# Patient Record
Sex: Female | Born: 1955 | Race: White | Hispanic: No | Marital: Married | Smoking: Never smoker
Health system: Southern US, Community
[De-identification: ages and names within clinical notes are randomized; demographics above are authoritative.]

## PROBLEM LIST (undated history)

## (undated) DIAGNOSIS — I82409 Acute embolism and thrombosis of unspecified deep veins of unspecified lower extremity: Secondary | ICD-10-CM

## (undated) DIAGNOSIS — E7800 Pure hypercholesterolemia, unspecified: Secondary | ICD-10-CM

## (undated) DIAGNOSIS — I2699 Other pulmonary embolism without acute cor pulmonale: Secondary | ICD-10-CM

## (undated) DIAGNOSIS — I10 Essential (primary) hypertension: Secondary | ICD-10-CM

## (undated) DIAGNOSIS — F32A Depression, unspecified: Secondary | ICD-10-CM

## (undated) DIAGNOSIS — F419 Anxiety disorder, unspecified: Secondary | ICD-10-CM

## (undated) HISTORY — PX: ESOPHAGOGASTRODUODENOSCOPY: SHX1529

## (undated) HISTORY — PX: APPENDECTOMY: SHX54

## (undated) HISTORY — PX: CHOLECYSTECTOMY: SHX55

## (undated) HISTORY — PX: TUBAL LIGATION: SHX77

## (undated) HISTORY — PX: BACK SURGERY: SHX140

## (undated) HISTORY — PX: TOTAL KNEE ARTHROPLASTY: SHX125

---

## 2010-10-09 ENCOUNTER — Encounter: Admission: RE | Admit: 2010-10-09 | Discharge: 2010-10-09 | Payer: Self-pay | Admitting: Neurosurgery

## 2010-10-23 ENCOUNTER — Encounter
Admission: RE | Admit: 2010-10-23 | Discharge: 2010-10-23 | Payer: Self-pay | Source: Home / Self Care | Attending: Neurosurgery | Admitting: Neurosurgery

## 2010-10-27 ENCOUNTER — Ambulatory Visit (HOSPITAL_COMMUNITY)
Admission: RE | Admit: 2010-10-27 | Discharge: 2010-10-28 | Payer: Self-pay | Source: Home / Self Care | Attending: Neurosurgery | Admitting: Neurosurgery

## 2011-01-18 LAB — CBC
HCT: 38.8 % (ref 36.0–46.0)
MCH: 28.1 pg (ref 26.0–34.0)
MCV: 85.8 fL (ref 78.0–100.0)
Platelets: 317 10*3/uL (ref 150–400)
RBC: 4.52 MIL/uL (ref 3.87–5.11)
RDW: 12.8 % (ref 11.5–15.5)

## 2011-01-18 LAB — BASIC METABOLIC PANEL
BUN: 7 mg/dL (ref 6–23)
CO2: 30 mEq/L (ref 19–32)
Chloride: 99 mEq/L (ref 96–112)
Creatinine, Ser: 0.82 mg/dL (ref 0.4–1.2)
Potassium: 3.3 mEq/L — ABNORMAL LOW (ref 3.5–5.1)

## 2011-04-29 ENCOUNTER — Other Ambulatory Visit: Payer: Self-pay | Admitting: Neurosurgery

## 2011-04-29 DIAGNOSIS — M549 Dorsalgia, unspecified: Secondary | ICD-10-CM

## 2011-04-29 DIAGNOSIS — M5410 Radiculopathy, site unspecified: Secondary | ICD-10-CM

## 2011-05-04 ENCOUNTER — Other Ambulatory Visit: Payer: Self-pay

## 2011-05-05 ENCOUNTER — Ambulatory Visit
Admission: RE | Admit: 2011-05-05 | Discharge: 2011-05-05 | Disposition: A | Discharge status: Home / Self Care | Payer: BC Managed Care – PPO | Source: Ambulatory Visit | Attending: Neurosurgery | Admitting: Neurosurgery

## 2011-05-05 DIAGNOSIS — M5410 Radiculopathy, site unspecified: Secondary | ICD-10-CM

## 2011-05-05 DIAGNOSIS — M549 Dorsalgia, unspecified: Secondary | ICD-10-CM

## 2012-01-15 IMAGING — RF DG MYELOGRAM LUMBAR
14 of 23 series · 14 of 23 positions shown · IV contrast (omnipaque)
Comparison: CT myelogram 10/09/2010.

MYELOGRAM INJECTION
TECHNIQUE: Informed consent was obtained from the patient prior to
the procedure, including potential complications of headache,
allergy, infection and pain. Specific instructions were given
regarding 24 hour bedrest post procedure to prevent post-LP
headache.  A timeout procedure was performed.  With the patient
prone, the lower back was prepped with Betadine.  1% Lidocaine was
used for local anesthesia.  Lumbar puncture was performed by the
radiologist at the L4-L5 level using a 22 gauge needle with return
of clear CSF.  15 cc of Omnipaque 180 was injected into the
subarachnoid space .
CLINICAL DATA: Low back pain after standing.  Prior lumbar
decompression for disc disease and foraminal narrowing at L4-5 and
L5-S1 on the left.  Overall improved left leg pain.
TECHNIQUE: Multidetector CT imaging of the lumbar spine was
performed following myelography.  Multiplanar CT image
reconstructions were also generated.

[Series 1: (hospital) · 1 of 1 slices shown]
[im 1/1]
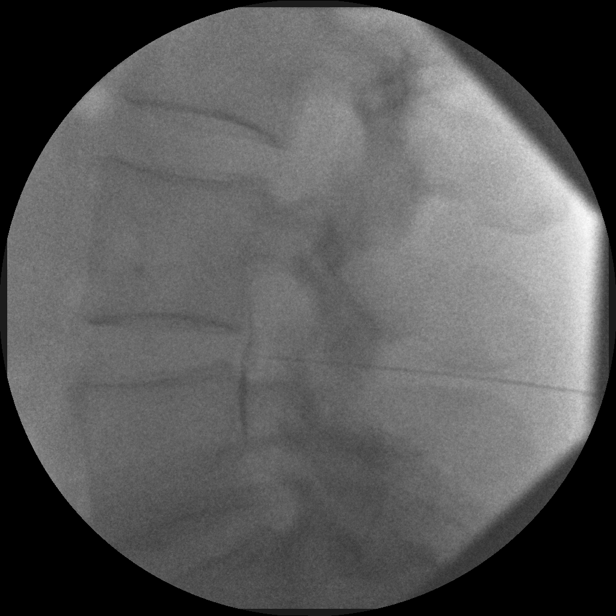

[Series 3: myelogram  white · 1 of 1 slices shown (1 of 11)]
[im 1/1]
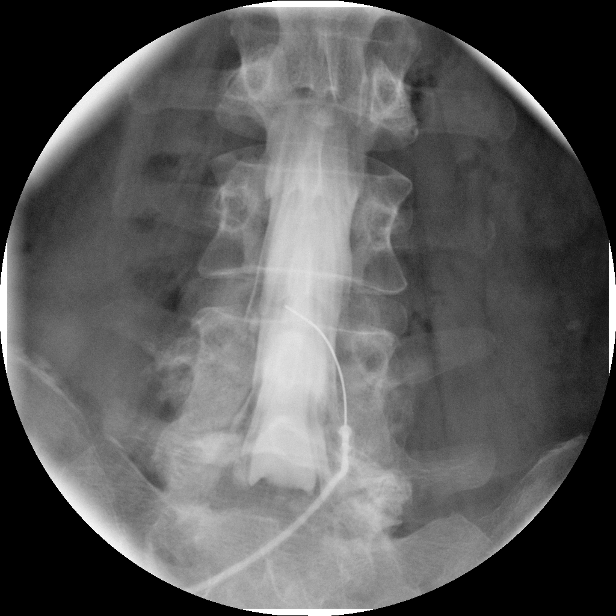

[Series 5: myelogram  white · 1 of 1 slices shown (2 of 11)]
[im 1/1]
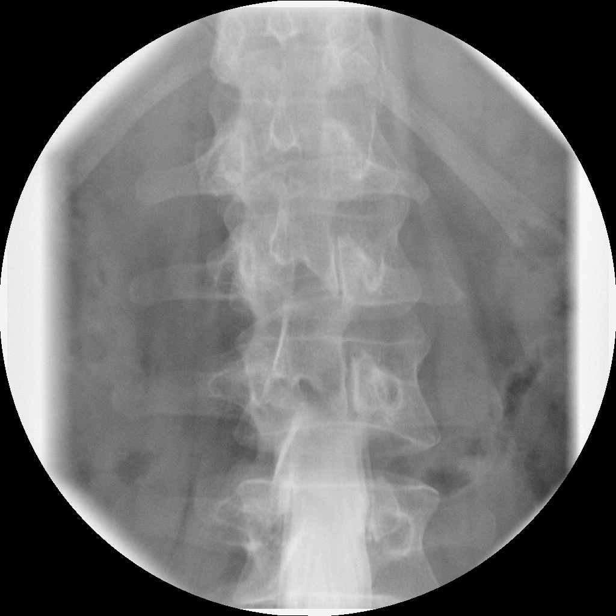

[Series 6: myelogram  white · 1 of 1 slices shown (3 of 11)]
[im 1/1]
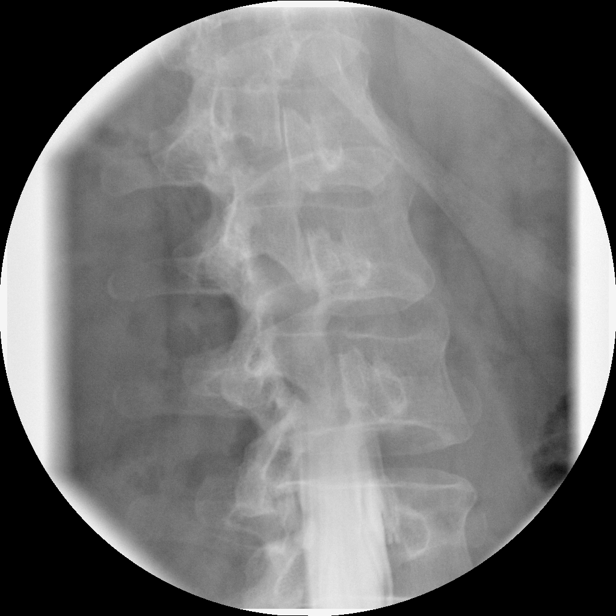

[Series 8: myelogram  white · 1 of 1 slices shown (4 of 11)]
[im 1/1]
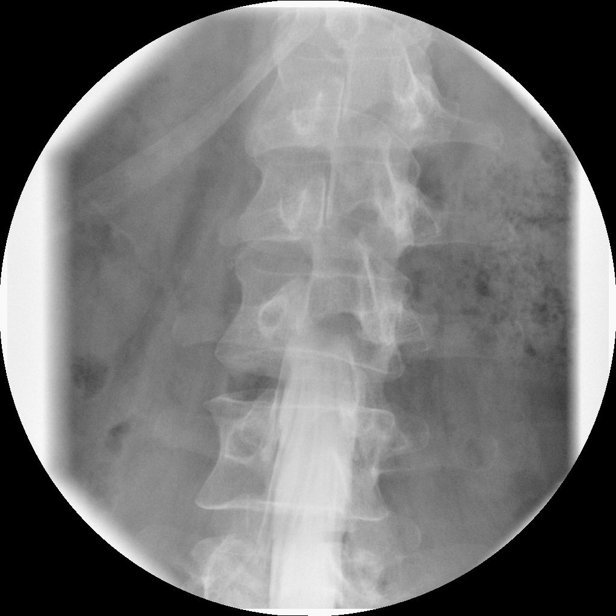

[Series 10: myelogram  white · 1 of 1 slices shown (5 of 11)]
[im 1/1]
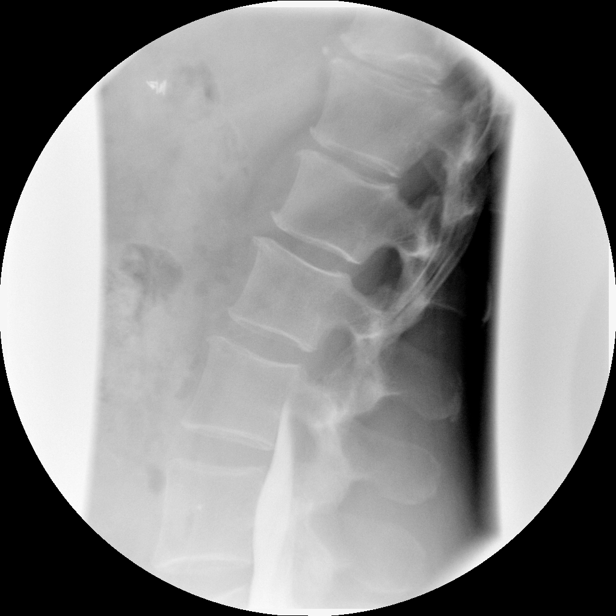

[Series 11: myelogram  white · 1 of 1 slices shown (6 of 11)]
[im 1/1]
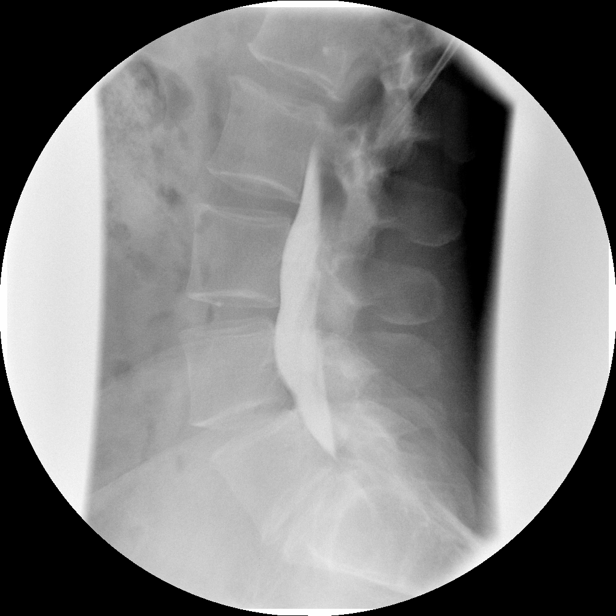

[Series 13: myelogram  white · 1 of 1 slices shown (7 of 11)]
[im 1/1]
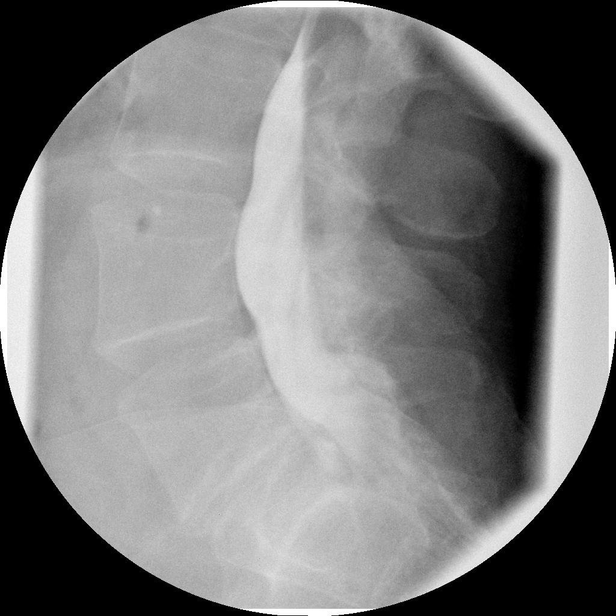

[Series 14: myelogram  white · 1 of 1 slices shown (8 of 11)]
[im 1/1]
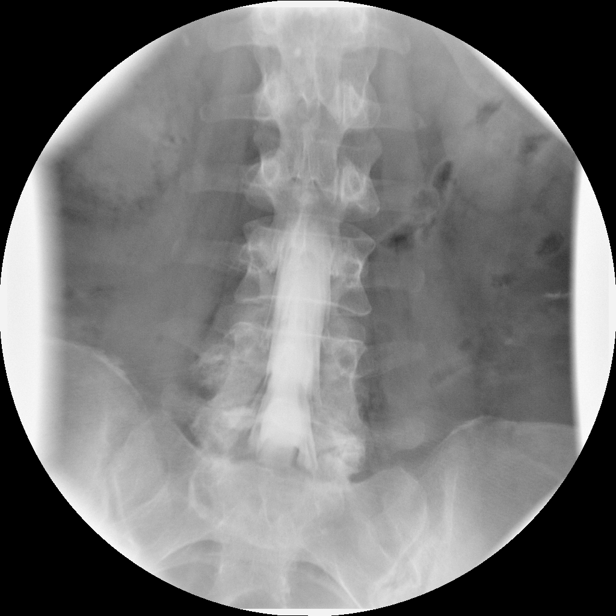

[Series 16: myelogram  white · 1 of 1 slices shown (9 of 11)]
[im 1/1]
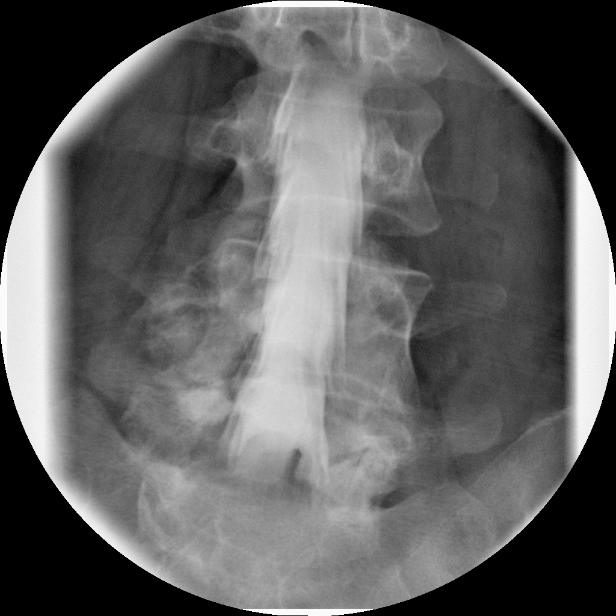

[Series 18: myelogram  white · 1 of 1 slices shown (10 of 11)]
[im 1/1]
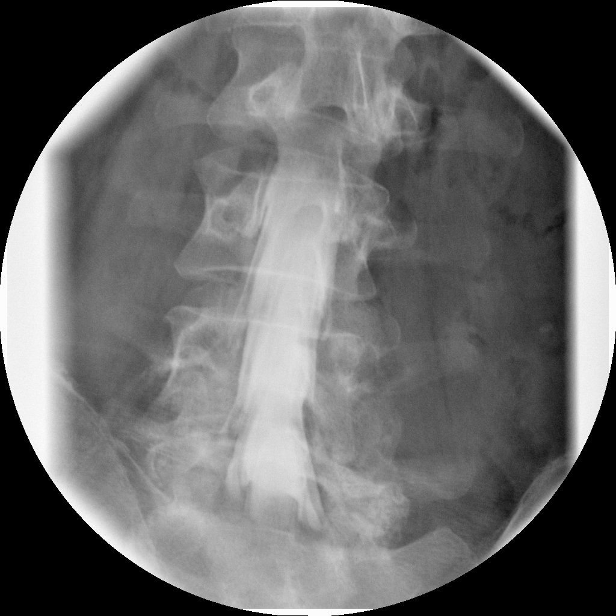

[Series 19: myelogram  white · 1 of 1 slices shown (11 of 11)]
[im 1/1]
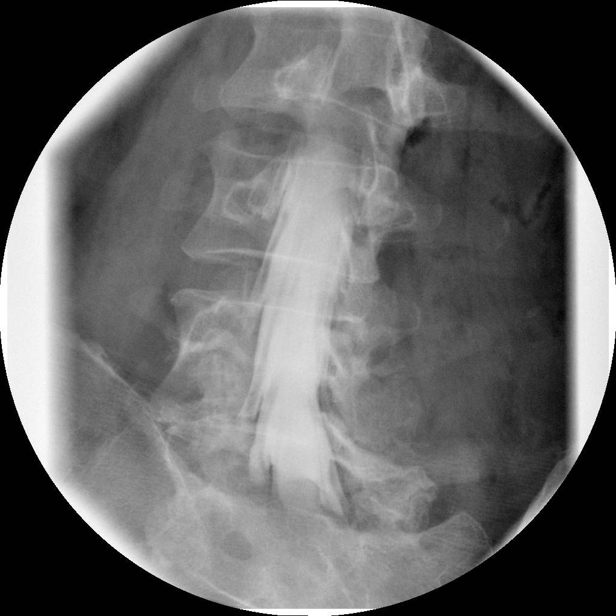

[Series 1002: view not recorded · 0.20mm/px · 1 of 1 slices shown (1 of 2)]
[im 1/1]
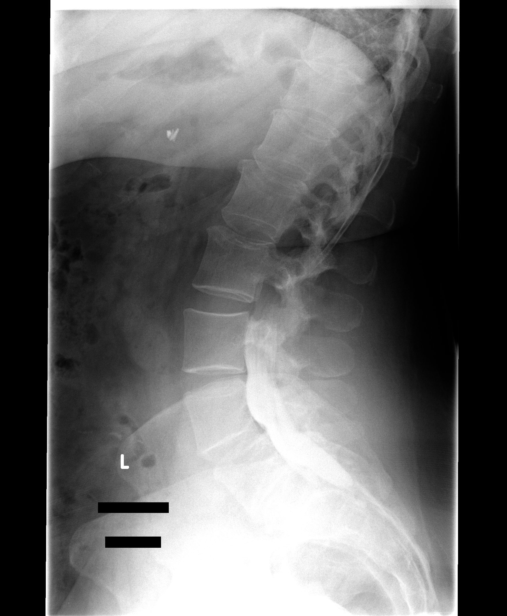

[Series 1004: view not recorded · 0.20mm/px · 1 of 1 slices shown (2 of 2)]
[im 1/1]
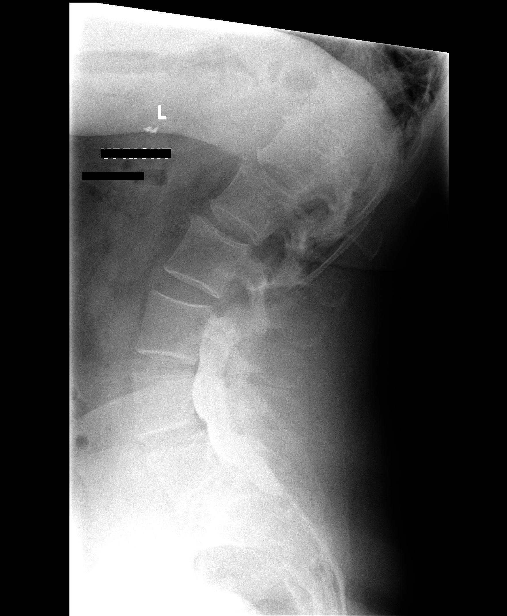

[14 of 23 positions shown; findings below may reference images not displayed]

IMPRESSION: Successful injection of  intrathecal contrast for myelography.

MYELOGRAM LUMBAR
FINDINGS: Transitional anatomy is present. Today's numbering scheme
corresponds to the previous myelogram and intraoperative films.

Good opacification lumbar subarachnoid space.  Anatomic alignment
is present.     There is mild disc space narrowing at L5-S1.  There
is no nerve root cut off or spinal stenosis.  Flexion/extension
show no abnormal movement.  Shallow ventral defect L5-S1.  Minimal
anterolisthesis S1 on S2.

Fluoroscopy Time: 1.23 minutes
IMPRESSION: As above.

CT MYELOGRAPHY LUMBAR SPINE
FINDINGS: No prevertebral or paraspinous masses

L1-2: Normal disc space

L2-3: Normal disc space

L3-4: Normal disc space.

L4-5: Postsurgical changes, left. Mild bulge.  Mild facet
arthropathy.  No compressive lesion.

L5-S1: Shallow central protrusion.  Postsurgical changes, left.
Severe left and moderate right facet arthropathy.  No compressive
lesion.

S1-S2:  Severe right and moderate left facet arthropathy.  2-3 ml
anterolisthesis.  Shallow calcific protrusion.  No neural
compression.

Compared to the preoperative CT myelogram, the nerve root
compression at L4-5 and L5-S1 is improved.
IMPRESSION: Unremarkable CT myelogram status post left L4-5 and left L5-S1
decompression.  The dominant finding remains advanced lower lumbar
facet arthropathy.

## 2013-09-17 DIAGNOSIS — G8929 Other chronic pain: Secondary | ICD-10-CM | POA: Insufficient documentation

## 2013-09-17 DIAGNOSIS — M47817 Spondylosis without myelopathy or radiculopathy, lumbosacral region: Secondary | ICD-10-CM | POA: Insufficient documentation

## 2013-09-17 DIAGNOSIS — M47816 Spondylosis without myelopathy or radiculopathy, lumbar region: Secondary | ICD-10-CM | POA: Insufficient documentation

## 2013-09-17 DIAGNOSIS — M545 Low back pain, unspecified: Secondary | ICD-10-CM | POA: Insufficient documentation

## 2013-09-17 HISTORY — DX: Spondylosis without myelopathy or radiculopathy, lumbosacral region: M47.817

## 2013-09-17 HISTORY — DX: Low back pain, unspecified: M54.50

## 2015-12-25 DIAGNOSIS — E669 Obesity, unspecified: Secondary | ICD-10-CM

## 2015-12-25 DIAGNOSIS — K589 Irritable bowel syndrome without diarrhea: Secondary | ICD-10-CM

## 2015-12-25 DIAGNOSIS — G4733 Obstructive sleep apnea (adult) (pediatric): Secondary | ICD-10-CM

## 2015-12-25 DIAGNOSIS — M5136 Other intervertebral disc degeneration, lumbar region: Secondary | ICD-10-CM

## 2015-12-25 DIAGNOSIS — F325 Major depressive disorder, single episode, in full remission: Secondary | ICD-10-CM | POA: Insufficient documentation

## 2015-12-25 DIAGNOSIS — J4520 Mild intermittent asthma, uncomplicated: Secondary | ICD-10-CM | POA: Insufficient documentation

## 2015-12-25 DIAGNOSIS — N398 Other specified disorders of urinary system: Secondary | ICD-10-CM

## 2015-12-25 DIAGNOSIS — K219 Gastro-esophageal reflux disease without esophagitis: Secondary | ICD-10-CM

## 2015-12-25 DIAGNOSIS — I10 Essential (primary) hypertension: Secondary | ICD-10-CM

## 2015-12-25 DIAGNOSIS — J309 Allergic rhinitis, unspecified: Secondary | ICD-10-CM

## 2015-12-25 DIAGNOSIS — M51369 Other intervertebral disc degeneration, lumbar region without mention of lumbar back pain or lower extremity pain: Secondary | ICD-10-CM

## 2015-12-25 DIAGNOSIS — R5382 Chronic fatigue, unspecified: Secondary | ICD-10-CM | POA: Insufficient documentation

## 2015-12-25 DIAGNOSIS — Z79899 Other long term (current) drug therapy: Secondary | ICD-10-CM | POA: Insufficient documentation

## 2015-12-25 DIAGNOSIS — R102 Pelvic and perineal pain: Secondary | ICD-10-CM | POA: Insufficient documentation

## 2015-12-25 DIAGNOSIS — F411 Generalized anxiety disorder: Secondary | ICD-10-CM

## 2015-12-25 HISTORY — DX: Essential (primary) hypertension: I10

## 2015-12-25 HISTORY — DX: Other intervertebral disc degeneration, lumbar region without mention of lumbar back pain or lower extremity pain: M51.369

## 2015-12-25 HISTORY — DX: Obstructive sleep apnea (adult) (pediatric): G47.33

## 2015-12-25 HISTORY — DX: Gastro-esophageal reflux disease without esophagitis: K21.9

## 2015-12-25 HISTORY — DX: Chronic fatigue, unspecified: R53.82

## 2015-12-25 HISTORY — DX: Major depressive disorder, single episode, in full remission: F32.5

## 2015-12-25 HISTORY — DX: Mild intermittent asthma, uncomplicated: J45.20

## 2015-12-25 HISTORY — DX: Generalized anxiety disorder: F41.1

## 2015-12-25 HISTORY — DX: Allergic rhinitis, unspecified: J30.9

## 2015-12-25 HISTORY — DX: Irritable bowel syndrome, unspecified: K58.9

## 2015-12-25 HISTORY — DX: Other specified disorders of urinary system: N39.8

## 2015-12-25 HISTORY — DX: Other intervertebral disc degeneration, lumbar region: M51.36

## 2015-12-25 HISTORY — DX: Obesity, unspecified: E66.9

## 2016-09-02 DIAGNOSIS — M546 Pain in thoracic spine: Secondary | ICD-10-CM | POA: Insufficient documentation

## 2017-02-22 DIAGNOSIS — E559 Vitamin D deficiency, unspecified: Secondary | ICD-10-CM | POA: Insufficient documentation

## 2017-02-22 HISTORY — DX: Vitamin D deficiency, unspecified: E55.9

## 2017-02-22 HISTORY — DX: Hypercalcemia: E83.52

## 2017-07-12 DIAGNOSIS — G43909 Migraine, unspecified, not intractable, without status migrainosus: Secondary | ICD-10-CM

## 2017-07-12 HISTORY — DX: Migraine, unspecified, not intractable, without status migrainosus: G43.909

## 2017-08-25 DIAGNOSIS — M791 Myalgia, unspecified site: Secondary | ICD-10-CM | POA: Insufficient documentation

## 2017-09-21 DIAGNOSIS — E785 Hyperlipidemia, unspecified: Secondary | ICD-10-CM | POA: Diagnosis not present

## 2017-09-21 DIAGNOSIS — I10 Essential (primary) hypertension: Secondary | ICD-10-CM

## 2017-09-21 DIAGNOSIS — R079 Chest pain, unspecified: Secondary | ICD-10-CM

## 2017-09-21 DIAGNOSIS — N179 Acute kidney failure, unspecified: Secondary | ICD-10-CM | POA: Diagnosis not present

## 2017-09-21 DIAGNOSIS — J45909 Unspecified asthma, uncomplicated: Secondary | ICD-10-CM | POA: Diagnosis not present

## 2017-09-21 DIAGNOSIS — K219 Gastro-esophageal reflux disease without esophagitis: Secondary | ICD-10-CM | POA: Diagnosis not present

## 2018-03-17 DIAGNOSIS — K2930 Chronic superficial gastritis without bleeding: Secondary | ICD-10-CM | POA: Insufficient documentation

## 2018-03-17 HISTORY — DX: Chronic superficial gastritis without bleeding: K29.30

## 2018-06-02 DIAGNOSIS — N134 Hydroureter: Secondary | ICD-10-CM | POA: Insufficient documentation

## 2018-11-20 DIAGNOSIS — R7309 Other abnormal glucose: Secondary | ICD-10-CM | POA: Insufficient documentation

## 2020-05-29 DIAGNOSIS — E782 Mixed hyperlipidemia: Secondary | ICD-10-CM | POA: Insufficient documentation

## 2020-05-29 DIAGNOSIS — F3342 Major depressive disorder, recurrent, in full remission: Secondary | ICD-10-CM | POA: Insufficient documentation

## 2020-05-29 HISTORY — DX: Mixed hyperlipidemia: E78.2

## 2020-10-24 DIAGNOSIS — Z1231 Encounter for screening mammogram for malignant neoplasm of breast: Secondary | ICD-10-CM | POA: Diagnosis not present

## 2020-12-02 DIAGNOSIS — M25561 Pain in right knee: Secondary | ICD-10-CM | POA: Diagnosis not present

## 2020-12-02 DIAGNOSIS — M1711 Unilateral primary osteoarthritis, right knee: Secondary | ICD-10-CM | POA: Diagnosis not present

## 2020-12-08 DIAGNOSIS — M47816 Spondylosis without myelopathy or radiculopathy, lumbar region: Secondary | ICD-10-CM | POA: Diagnosis not present

## 2020-12-17 DIAGNOSIS — R928 Other abnormal and inconclusive findings on diagnostic imaging of breast: Secondary | ICD-10-CM | POA: Diagnosis not present

## 2020-12-17 DIAGNOSIS — N6002 Solitary cyst of left breast: Secondary | ICD-10-CM | POA: Diagnosis not present

## 2020-12-22 DIAGNOSIS — M961 Postlaminectomy syndrome, not elsewhere classified: Secondary | ICD-10-CM | POA: Diagnosis not present

## 2020-12-22 DIAGNOSIS — M47816 Spondylosis without myelopathy or radiculopathy, lumbar region: Secondary | ICD-10-CM | POA: Diagnosis not present

## 2021-01-06 DIAGNOSIS — M47816 Spondylosis without myelopathy or radiculopathy, lumbar region: Secondary | ICD-10-CM | POA: Diagnosis not present

## 2021-01-06 DIAGNOSIS — M961 Postlaminectomy syndrome, not elsewhere classified: Secondary | ICD-10-CM | POA: Diagnosis not present

## 2021-01-19 DIAGNOSIS — F3342 Major depressive disorder, recurrent, in full remission: Secondary | ICD-10-CM | POA: Diagnosis not present

## 2021-01-19 DIAGNOSIS — N939 Abnormal uterine and vaginal bleeding, unspecified: Secondary | ICD-10-CM | POA: Insufficient documentation

## 2021-01-19 DIAGNOSIS — R7309 Other abnormal glucose: Secondary | ICD-10-CM | POA: Diagnosis not present

## 2021-01-19 DIAGNOSIS — I10 Essential (primary) hypertension: Secondary | ICD-10-CM | POA: Diagnosis not present

## 2021-01-19 DIAGNOSIS — E782 Mixed hyperlipidemia: Secondary | ICD-10-CM | POA: Diagnosis not present

## 2021-01-19 DIAGNOSIS — R002 Palpitations: Secondary | ICD-10-CM | POA: Insufficient documentation

## 2021-01-19 DIAGNOSIS — F3341 Major depressive disorder, recurrent, in partial remission: Secondary | ICD-10-CM | POA: Diagnosis not present

## 2021-01-19 DIAGNOSIS — K219 Gastro-esophageal reflux disease without esophagitis: Secondary | ICD-10-CM | POA: Diagnosis not present

## 2021-01-19 HISTORY — DX: Palpitations: R00.2

## 2021-01-24 DIAGNOSIS — R002 Palpitations: Secondary | ICD-10-CM | POA: Diagnosis not present

## 2021-02-09 DIAGNOSIS — M47816 Spondylosis without myelopathy or radiculopathy, lumbar region: Secondary | ICD-10-CM | POA: Diagnosis not present

## 2021-02-10 DIAGNOSIS — D251 Intramural leiomyoma of uterus: Secondary | ICD-10-CM | POA: Insufficient documentation

## 2021-02-10 DIAGNOSIS — D250 Submucous leiomyoma of uterus: Secondary | ICD-10-CM | POA: Diagnosis not present

## 2021-02-10 DIAGNOSIS — N950 Postmenopausal bleeding: Secondary | ICD-10-CM | POA: Insufficient documentation

## 2021-02-24 DIAGNOSIS — M5134 Other intervertebral disc degeneration, thoracic region: Secondary | ICD-10-CM | POA: Diagnosis not present

## 2021-02-24 DIAGNOSIS — M5416 Radiculopathy, lumbar region: Secondary | ICD-10-CM | POA: Diagnosis not present

## 2021-02-24 DIAGNOSIS — M961 Postlaminectomy syndrome, not elsewhere classified: Secondary | ICD-10-CM | POA: Diagnosis not present

## 2021-04-30 DIAGNOSIS — M47816 Spondylosis without myelopathy or radiculopathy, lumbar region: Secondary | ICD-10-CM | POA: Diagnosis not present

## 2021-05-18 DIAGNOSIS — I2694 Multiple subsegmental pulmonary emboli without acute cor pulmonale: Secondary | ICD-10-CM | POA: Diagnosis not present

## 2021-05-18 DIAGNOSIS — M7989 Other specified soft tissue disorders: Secondary | ICD-10-CM | POA: Diagnosis not present

## 2021-05-18 DIAGNOSIS — M79662 Pain in left lower leg: Secondary | ICD-10-CM | POA: Diagnosis not present

## 2021-05-18 DIAGNOSIS — R42 Dizziness and giddiness: Secondary | ICD-10-CM | POA: Diagnosis not present

## 2021-05-18 DIAGNOSIS — I82412 Acute embolism and thrombosis of left femoral vein: Secondary | ICD-10-CM | POA: Diagnosis not present

## 2021-05-18 DIAGNOSIS — I82432 Acute embolism and thrombosis of left popliteal vein: Secondary | ICD-10-CM | POA: Diagnosis not present

## 2021-05-18 DIAGNOSIS — K449 Diaphragmatic hernia without obstruction or gangrene: Secondary | ICD-10-CM | POA: Diagnosis not present

## 2021-05-18 DIAGNOSIS — I2699 Other pulmonary embolism without acute cor pulmonale: Secondary | ICD-10-CM | POA: Diagnosis not present

## 2021-05-19 DIAGNOSIS — I2694 Multiple subsegmental pulmonary emboli without acute cor pulmonale: Secondary | ICD-10-CM | POA: Diagnosis not present

## 2021-05-19 DIAGNOSIS — E785 Hyperlipidemia, unspecified: Secondary | ICD-10-CM | POA: Diagnosis not present

## 2021-05-19 DIAGNOSIS — K219 Gastro-esophageal reflux disease without esophagitis: Secondary | ICD-10-CM | POA: Diagnosis not present

## 2021-05-19 DIAGNOSIS — M79662 Pain in left lower leg: Secondary | ICD-10-CM | POA: Diagnosis not present

## 2021-05-19 DIAGNOSIS — I2699 Other pulmonary embolism without acute cor pulmonale: Secondary | ICD-10-CM | POA: Diagnosis not present

## 2021-05-19 DIAGNOSIS — K449 Diaphragmatic hernia without obstruction or gangrene: Secondary | ICD-10-CM | POA: Diagnosis not present

## 2021-05-19 DIAGNOSIS — J45909 Unspecified asthma, uncomplicated: Secondary | ICD-10-CM | POA: Diagnosis not present

## 2021-05-19 DIAGNOSIS — Z7982 Long term (current) use of aspirin: Secondary | ICD-10-CM | POA: Diagnosis not present

## 2021-05-19 DIAGNOSIS — Z79899 Other long term (current) drug therapy: Secondary | ICD-10-CM | POA: Diagnosis not present

## 2021-05-19 DIAGNOSIS — N170 Acute kidney failure with tubular necrosis: Secondary | ICD-10-CM | POA: Diagnosis not present

## 2021-05-19 DIAGNOSIS — I82432 Acute embolism and thrombosis of left popliteal vein: Secondary | ICD-10-CM | POA: Diagnosis not present

## 2021-05-19 DIAGNOSIS — N179 Acute kidney failure, unspecified: Secondary | ICD-10-CM | POA: Diagnosis not present

## 2021-05-19 DIAGNOSIS — Z86718 Personal history of other venous thrombosis and embolism: Secondary | ICD-10-CM | POA: Diagnosis not present

## 2021-05-19 DIAGNOSIS — R000 Tachycardia, unspecified: Secondary | ICD-10-CM | POA: Diagnosis not present

## 2021-05-19 DIAGNOSIS — I82412 Acute embolism and thrombosis of left femoral vein: Secondary | ICD-10-CM | POA: Diagnosis not present

## 2021-05-19 DIAGNOSIS — M7989 Other specified soft tissue disorders: Secondary | ICD-10-CM | POA: Diagnosis not present

## 2021-05-19 DIAGNOSIS — R42 Dizziness and giddiness: Secondary | ICD-10-CM | POA: Diagnosis not present

## 2021-05-19 DIAGNOSIS — Z7901 Long term (current) use of anticoagulants: Secondary | ICD-10-CM | POA: Diagnosis not present

## 2021-05-19 DIAGNOSIS — M1990 Unspecified osteoarthritis, unspecified site: Secondary | ICD-10-CM | POA: Diagnosis not present

## 2021-05-19 DIAGNOSIS — I10 Essential (primary) hypertension: Secondary | ICD-10-CM | POA: Diagnosis not present

## 2021-05-26 DIAGNOSIS — Z09 Encounter for follow-up examination after completed treatment for conditions other than malignant neoplasm: Secondary | ICD-10-CM | POA: Diagnosis not present

## 2021-05-26 DIAGNOSIS — I2692 Saddle embolus of pulmonary artery without acute cor pulmonale: Secondary | ICD-10-CM | POA: Insufficient documentation

## 2021-05-26 DIAGNOSIS — Z86718 Personal history of other venous thrombosis and embolism: Secondary | ICD-10-CM | POA: Diagnosis not present

## 2021-05-26 HISTORY — DX: Saddle embolus of pulmonary artery without acute cor pulmonale: I26.92

## 2021-06-22 DIAGNOSIS — M5134 Other intervertebral disc degeneration, thoracic region: Secondary | ICD-10-CM | POA: Diagnosis not present

## 2021-06-22 DIAGNOSIS — M546 Pain in thoracic spine: Secondary | ICD-10-CM | POA: Diagnosis not present

## 2021-07-29 DIAGNOSIS — D251 Intramural leiomyoma of uterus: Secondary | ICD-10-CM | POA: Diagnosis not present

## 2021-07-29 DIAGNOSIS — D250 Submucous leiomyoma of uterus: Secondary | ICD-10-CM | POA: Diagnosis not present

## 2021-07-29 DIAGNOSIS — N950 Postmenopausal bleeding: Secondary | ICD-10-CM | POA: Diagnosis not present

## 2021-08-17 ENCOUNTER — Other Ambulatory Visit: Payer: Self-pay

## 2021-08-17 ENCOUNTER — Emergency Department (HOSPITAL_COMMUNITY)
Admission: EM | Admit: 2021-08-17 | Discharge: 2021-08-17 | Disposition: A | Discharge status: Home / Self Care | Payer: PPO | Attending: Emergency Medicine | Admitting: Emergency Medicine

## 2021-08-17 ENCOUNTER — Encounter (HOSPITAL_COMMUNITY): Payer: Self-pay

## 2021-08-17 DIAGNOSIS — M7989 Other specified soft tissue disorders: Secondary | ICD-10-CM | POA: Diagnosis not present

## 2021-08-17 DIAGNOSIS — I10 Essential (primary) hypertension: Secondary | ICD-10-CM | POA: Insufficient documentation

## 2021-08-17 DIAGNOSIS — R230 Cyanosis: Secondary | ICD-10-CM | POA: Insufficient documentation

## 2021-08-17 HISTORY — DX: Other pulmonary embolism without acute cor pulmonale: I26.99

## 2021-08-17 HISTORY — DX: Other specified soft tissue disorders: M79.89

## 2021-08-17 HISTORY — DX: Essential (primary) hypertension: I10

## 2021-08-17 HISTORY — DX: Acute embolism and thrombosis of unspecified deep veins of unspecified lower extremity: I82.409

## 2021-08-17 HISTORY — DX: Pure hypercholesterolemia, unspecified: E78.00

## 2021-08-17 HISTORY — DX: Depression, unspecified: F32.A

## 2021-08-17 HISTORY — DX: Cyanosis: R23.0

## 2021-08-17 HISTORY — DX: Anxiety disorder, unspecified: F41.9

## 2021-08-17 LAB — COMPREHENSIVE METABOLIC PANEL
ALT: 9 U/L (ref 0–44)
AST: 14 U/L — ABNORMAL LOW (ref 15–41)
Albumin: 3.9 g/dL (ref 3.5–5.0)
Alkaline Phosphatase: 75 U/L (ref 38–126)
Anion gap: 8 (ref 5–15)
BUN: 15 mg/dL (ref 8–23)
CO2: 26 mmol/L (ref 22–32)
Calcium: 10.1 mg/dL (ref 8.9–10.3)
Chloride: 106 mmol/L (ref 98–111)
Creatinine, Ser: 0.95 mg/dL (ref 0.44–1.00)
GFR, Estimated: >60 mL/min (ref >60)
Glucose, Bld: 89 mg/dL (ref 70–99)
Potassium: 4.1 mmol/L (ref 3.5–5.1)
Sodium: 140 mmol/L (ref 135–145)
Total Bilirubin: 0.5 mg/dL (ref 0.3–1.2)
Total Protein: 6.9 g/dL (ref 6.5–8.1)

## 2021-08-17 LAB — CBC WITH DIFFERENTIAL/PLATELET
Abs Immature Granulocytes: 0.04 10*3/uL (ref 0.00–0.07)
Basophils Absolute: 0.1 10*3/uL (ref 0.0–0.1)
Basophils Relative: 1 %
Eosinophils Absolute: 0.3 10*3/uL (ref 0.0–0.5)
Eosinophils Relative: 2 %
HCT: 40.6 % (ref 36.0–46.0)
Hemoglobin: 13.2 g/dL (ref 12.0–15.0)
Immature Granulocytes: 0 %
Lymphocytes Relative: 28 %
Lymphs Abs: 2.9 10*3/uL (ref 0.7–4.0)
MCH: 29.7 pg (ref 26.0–34.0)
MCHC: 32.5 g/dL (ref 30.0–36.0)
MCV: 91.4 fL (ref 80.0–100.0)
Monocytes Absolute: 0.5 10*3/uL (ref 0.1–1.0)
Monocytes Relative: 5 %
Neutro Abs: 6.6 10*3/uL (ref 1.7–7.7)
Neutrophils Relative %: 64 %
Platelets: 325 10*3/uL (ref 150–400)
RBC: 4.44 MIL/uL (ref 3.87–5.11)
RDW: 12.4 % (ref 11.5–15.5)
WBC: 10.4 10*3/uL (ref 4.0–10.5)
nRBC: 0 % (ref 0.0–0.2)

## 2021-08-17 NOTE — ED Provider Notes (Signed)
[facility] EMERGENCY DEPARTMENT Provider Note   CSN: [id number] Arrival date & time: 08/17/21  1844     History Chief Complaint  Patient presents with   Leg Swelling    Connie Duncan is a 65 y.o. female.  HPI This is a 65 year old female with history of DVT, hypertension, hypercholesterolemia, prior PE on anticoagulation who presents with left leg swelling.  Patient reported some chronic issues with left lower extremity swelling and she has a known history of prior DVT in this leg.  Also reports has been bulk is normal in color for the last several months, intermittently becoming more blue, but constantly with some degree of blueness to it.  Went to see her PCP about this today who was concerned to the ED for further evaluation.  She reports compliance with all her meds including anticoagulation. Denies pain or injury to the leg.     Past Medical History:  Diagnosis Date   Anxiety    Depression    DVT (deep venous thrombosis) (HCC)    High cholesterol    Hypertension    Pulmonary emboli (HCC)     There are no problems to display for this patient.   History reviewed. No pertinent surgical history.   OB History   No obstetric history on file.     History reviewed. No pertinent family history.  Social History   Tobacco Use   Smoking status: Never   Smokeless tobacco: Never  Vaping Use   Vaping Use: Never used  Substance Use Topics   Alcohol use: Never   Drug use: Never    Home Medications Prior to Admission medications   Not on File    Allergies    Patient has no known allergies.  Review of Systems   Review of Systems  Constitutional:  Negative for chills and fever.  HENT:  Negative for ear pain and sore throat.   Eyes:  Negative for pain and visual disturbance.  Respiratory:  Negative for cough and shortness of breath.   Cardiovascular:  Negative for chest pain and palpitations.  Gastrointestinal:  Negative for abdominal pain  and vomiting.  Genitourinary:  Negative for dysuria and hematuria.  Musculoskeletal:  Positive for arthralgias and joint swelling. Negative for back pain.  Skin:  Positive for color change. Negative for rash.  Neurological:  Negative for seizures and syncope.  All other systems reviewed and are negative.  Physical Exam Updated Vital Signs BP (!) 122/91   Pulse 83   Temp 98 F (36.7 C) (Oral)   Resp 12   Ht 5\' 8"  (1.727 m)   Wt 82.1 kg   SpO2 97%   BMI 27.52 kg/m   Physical Exam Vitals and nursing note reviewed.  Constitutional:      General: She is not in acute distress.    Appearance: She is well-developed.  HENT:     Head: Normocephalic and atraumatic.  Eyes:     Conjunctiva/sclera: Conjunctivae normal.  Cardiovascular:     Rate and Rhythm: Normal rate and regular rhythm.     Heart sounds: No murmur heard. Pulmonary:     Effort: Pulmonary effort is normal. No respiratory distress.     Breath sounds: Normal breath sounds.  Abdominal:     Palpations: Abdomen is soft.     Tenderness: There is no abdominal tenderness.  Musculoskeletal:        General: No swelling, tenderness or deformity.     Cervical back: Neck supple.  Right lower leg: No edema.     Left lower leg: Edema present.  Skin:    General: Skin is warm and dry.  Neurological:     General: No focal deficit present.     Mental Status: She is alert.    ED Results / Procedures / Treatments   Labs (all labs ordered are listed, but only abnormal results are displayed) Labs Reviewed  COMPREHENSIVE METABOLIC PANEL - Abnormal; Notable for the following components:      Result Value   AST 14 (*)    All other components within normal limits  CBC WITH DIFFERENTIAL/PLATELET    EKG EKG Interpretation  Date/Time:  Monday August 17 2021 21:22:45 EDT Ventricular Rate:  82 PR Interval:  176 QRS Duration: 88 QT Interval:  393 QTC Calculation: 459 R Axis:   -2 Text Interpretation: Sinus rhythm Abnormal  R-wave progression, early transition similar to 2011 Confirmed by Sherwood Gambler [phone]) on 08/17/2021 9:54:23 PM  Radiology No results found.  Procedures Procedures   Medications Ordered in ED Medications - No data to display  ED Course  I have reviewed the triage vital signs and the nursing notes.  Pertinent labs & imaging results that were available during my care of the patient were reviewed by me and considered in my medical decision making (see chart for details).    MDM Rules/Calculators/A&P                          Patient is stable and well-appearing.  Very mild edema to the left lower extremity that patient reports is chronic.  There is no blue or purple discoloration to the foot.  She has palpable 1+ DP pulse on the left that is equal to that on the right.  The extremity is warm and she has full range of motion, strength, and sensation. No evidence of acute arterial occlusion.  Patient is already on anticoagulation treatment for possible DVT.  Unable to do venous or arterial imaging overnight.  Patient is stable for discharge home and will follow up tomorrow for this imaging.  Given information for vascular surgery as well who patient should also follow-up with.  Advised her to continue taking all her medications as prescribed including anticoagulation.  Given strict return precautions.  Patient expressed understanding, discharged home in stable condition.  Final Clinical Impression(s) / ED Diagnoses Final diagnoses:  Left leg swelling    Rx / DC Orders ED Discharge Orders          Ordered    LE VENOUS        08/17/21 2217    US ARTERIAL LOWER EXTREMITY DUPLEX LEFT (NON-ABI)        08/17/21 2217             Coralee Pesa, MD 08/18/21 4503    Sherwood Gambler, MD 08/21/21 1615

## 2021-08-17 NOTE — ED Provider Notes (Signed)
Emergency Medicine Provider Triage Evaluation Note  Connie Duncan , a 65 y.o. female  was evaluated in triage.  Pt complains of ongoing several months of right leg and discoloration.  She states that it has not been different in terms of color, pain, temperature over the past few months.  She states that she went to her primary care provider today and they were unable to find a pulse in the foot and told to come immediately to the ER.  She has a history of DVTs and is on DOAC therapy for this.   Review of Systems  Positive: Left leg color change and foot swelling Negative: Fever  Physical Exam  BP (!) 120/95 (BP Location: Left Arm)   Pulse (!) 106   Temp 99 F (37.2 C) (Oral)   Resp 18   Ht 5\' 8"  (1.727 m)   Wt 82.1 kg   SpO2 97%   BMI 27.52 kg/m  Gen:   Awake, no distress   Resp:  Normal effort  MSK:   Moves extremities without difficulty  Other:  Faintly palpable dorsalis pedis and posterior tibial.  Confirmed with Doppler she has pulses in dorsalis pedis and posterior tibial of left lower extremity.  Good cap refill.  Left foot is slightly cool to touch.  Medical Decision Making  Medically screening exam initiated at 7:02 PM.  Appropriate orders placed.  Connie Duncan was informed that the remainder of the evaluation will be completed by another provider, this initial triage assessment does not replace that evaluation, and the importance of remaining in the ED until their evaluation is complete.  Patient has palpable pulses that are faint but confirmed with Doppler. Will obtain basic labs, lower extremity ultrasound obtained given prolonged triage wait times patient may be here when ultrasound is available.   Connie Duncan [place], [place] 08/17/21 1904    Sherwood Gambler, MD 08/18/21 1530

## 2021-08-17 NOTE — ED Notes (Signed)
Patient verbalizes understanding of discharge instructions. Follow-up care reviewed. Pt made aware to come to the hospital tomorrow around 11am to have vascular study done. Opportunity for questioning and answers were provided. Armband removed by staff, pt discharged from ED ambulatory.

## 2021-08-17 NOTE — ED Triage Notes (Signed)
Patient sent by pcp due to no pulse in left leg.  Patient has hx of blood clots this summer.

## 2021-08-17 NOTE — Discharge Instructions (Addendum)
Call the number above to schedule an appointment with vascular surgery for further evaluation.  Refer to the instructions below on where to come tomorrow to have your vascular studies completed.   IMPORTANT PATIENT INSTRUCTIONS:  You have been scheduled for an Outpatient Vascular Study at [facility]    If tomorrow is a Saturday, Sunday or holiday, please go to the [facility] Emergency Department Registration Desk at 11 am tomorrow morning and tell them you are there for a vascular study.   If tomorrow is a weekday (Monday-Friday), please go to [facility] at 11 am and tell them you are there for a vascular study.

## 2021-08-18 ENCOUNTER — Encounter (HOSPITAL_COMMUNITY): Payer: Self-pay | Admitting: Emergency Medicine

## 2021-08-18 ENCOUNTER — Ambulatory Visit (HOSPITAL_COMMUNITY)
Admission: RE | Admit: 2021-08-18 | Discharge: 2021-08-18 | Disposition: A | Discharge status: Home / Self Care | Payer: PPO | Source: Ambulatory Visit | Attending: Emergency Medicine | Admitting: Emergency Medicine

## 2021-08-18 ENCOUNTER — Other Ambulatory Visit: Payer: Self-pay

## 2021-08-18 ENCOUNTER — Emergency Department (HOSPITAL_COMMUNITY)
Admission: EM | Admit: 2021-08-18 | Discharge: 2021-08-18 | Disposition: A | Discharge status: Home / Self Care | Payer: PPO | Source: Home / Self Care | Attending: Emergency Medicine | Admitting: Emergency Medicine

## 2021-08-18 DIAGNOSIS — M7989 Other specified soft tissue disorders: Secondary | ICD-10-CM | POA: Diagnosis not present

## 2021-08-18 DIAGNOSIS — I10 Essential (primary) hypertension: Secondary | ICD-10-CM | POA: Insufficient documentation

## 2021-08-18 DIAGNOSIS — Z79899 Other long term (current) drug therapy: Secondary | ICD-10-CM | POA: Insufficient documentation

## 2021-08-18 DIAGNOSIS — Z86718 Personal history of other venous thrombosis and embolism: Secondary | ICD-10-CM | POA: Insufficient documentation

## 2021-08-18 DIAGNOSIS — Z7901 Long term (current) use of anticoagulants: Secondary | ICD-10-CM | POA: Insufficient documentation

## 2021-08-18 LAB — CBC WITH DIFFERENTIAL/PLATELET
Abs Immature Granulocytes: 0.02 10*3/uL (ref 0.00–0.07)
Basophils Absolute: 0.1 10*3/uL (ref 0.0–0.1)
Basophils Relative: 1 %
Eosinophils Absolute: 0.2 10*3/uL (ref 0.0–0.5)
Eosinophils Relative: 3 %
HCT: 39 % (ref 36.0–46.0)
Hemoglobin: 12.6 g/dL (ref 12.0–15.0)
Immature Granulocytes: 0 %
Lymphocytes Relative: 26 %
Lymphs Abs: 1.9 10*3/uL (ref 0.7–4.0)
MCH: 29.4 pg (ref 26.0–34.0)
MCHC: 32.3 g/dL (ref 30.0–36.0)
MCV: 91.1 fL (ref 80.0–100.0)
Monocytes Absolute: 0.5 10*3/uL (ref 0.1–1.0)
Monocytes Relative: 6 %
Neutro Abs: 4.7 10*3/uL (ref 1.7–7.7)
Neutrophils Relative %: 64 %
Platelets: 285 10*3/uL (ref 150–400)
RBC: 4.28 MIL/uL (ref 3.87–5.11)
RDW: 12.4 % (ref 11.5–15.5)
WBC: 7.4 10*3/uL (ref 4.0–10.5)
nRBC: 0 % (ref 0.0–0.2)

## 2021-08-18 LAB — BASIC METABOLIC PANEL
Anion gap: 7 (ref 5–15)
BUN: 16 mg/dL (ref 8–23)
CO2: 28 mmol/L (ref 22–32)
Calcium: 10.2 mg/dL (ref 8.9–10.3)
Chloride: 104 mmol/L (ref 98–111)
Creatinine, Ser: 0.85 mg/dL (ref 0.44–1.00)
GFR, Estimated: >60 mL/min (ref >60)
Glucose, Bld: 105 mg/dL — ABNORMAL HIGH (ref 70–99)
Potassium: 4.3 mmol/L (ref 3.5–5.1)
Sodium: 139 mmol/L (ref 135–145)

## 2021-08-18 NOTE — ED Provider Notes (Signed)
Emergency Medicine Provider Triage Evaluation Note  Connie Duncan , a 65 y.o. female  was evaluated in triage.  Pt had lle [place] pta and was told she had a clot and needed to come to the ed. Denies chest pain or sob.   Review of Systems  Positive: Lle swelling Negative: Chest pain, sob  Physical Exam  BP (!) 143/103 (BP Location: Left Arm)   Pulse 89   Temp 98.8 F (37.1 C) (Oral)   Resp 16   SpO2 96%  Gen:   Awake, no distress   Resp:  Normal effort  MSK:   Moves extremities without difficulty  Other:  Discoloration to the lle, left DP pulse is dopplerable  Medical Decision Making  Medically screening exam initiated at 12:12 PM.  Appropriate orders placed.  Connie Duncan was informed that the remainder of the evaluation will be completed by another provider, this initial triage assessment does not replace that evaluation, and the importance of remaining in the ED until their evaluation is complete.     Rodney Booze, PA-C 08/18/21 1225    Wyvonnia Dusky, MD 08/18/21 1420

## 2021-08-18 NOTE — ED Notes (Signed)
ED Provider at bedside. 

## 2021-08-18 NOTE — Progress Notes (Signed)
Lower extremity venous LT study completed.   Please see CV Proc for preliminary results.   Patient taken to ED for further evaluation.  Darlin Coco, RDMS, RVT

## 2021-08-18 NOTE — ED Provider Notes (Signed)
[facility] EMERGENCY DEPARTMENT Provider Note   CSN: [id number] Arrival date & time: 08/18/21  1136     History Chief Complaint  Patient presents with   Leg Swelling    Left     Connie Duncan is a 65 y.o. female who presents with concern for possible DVT.  Patient was seen in the ED last night, however was unable to undergo ultrasonography due to the hour in the emergency department.  She presented to the hospital again today for outpatient DVT and arterial Dopplers of the lower extremities.  Unfortunately, patient was brought to the emergency department after only her outpatient DVT study due to concern by the vascular ultrasonographer for possible DVT.  This time she does continue to endorse intermittent color change in the left foot with pallor or bluish hue without pain or loss of sensation.  I personally reviewed this patient's medical records.  She has history of DVT and PE on Xarelto.  Denies any missed doses.  HPI     Past Medical History:  Diagnosis Date   Anxiety    Depression    DVT (deep venous thrombosis) (HCC)    High cholesterol    Hypertension    Pulmonary emboli (HCC)     There are no problems to display for this patient.   No past surgical history on file.   OB History   No obstetric history on file.     No family history on file.  Social History   Tobacco Use   Smoking status: Never   Smokeless tobacco: Never  Vaping Use   Vaping Use: Never used  Substance Use Topics   Alcohol use: Never   Drug use: Never    Home Medications Prior to Admission medications   Medication Sig Start Date End Date Taking? Authorizing Provider  eletriptan (RELPAX) 40 MG tablet Take 40 mg by mouth daily as needed for migraine. 07/17/21  Yes [provider]  famotidine (PEPCID) 40 MG tablet Take 40 mg by mouth daily. 07/28/21   [provider]  lisinopril (ZESTRIL) 30 MG tablet Take 30 mg by mouth daily. 07/07/21   [provider]  omeprazole (PRILOSEC) 20 MG capsule Take 20 mg by mouth daily. 07/07/21   [provider]  rosuvastatin (CRESTOR) 20 MG tablet Take 20 mg by mouth at bedtime. 05/26/21   [provider]  venlafaxine XR (EFFEXOR-XR) 75 MG 24 hr capsule Take 75 mg by mouth daily. 06/01/21   [provider]  XARELTO 20 MG TABS tablet Take 20 mg by mouth daily. 08/10/21   [provider]    Allergies    Patient has no known allergies.  Review of Systems   Review of Systems  Constitutional: Negative.   HENT: Negative.    Respiratory: Negative.    Cardiovascular: Negative.   Gastrointestinal: Negative.   Genitourinary: Negative.   Musculoskeletal: Negative.   Skin:  Positive for color change.  Neurological: Negative.    Physical Exam Updated Vital Signs BP (!) 137/99 (BP Location: Left Arm)   Pulse 75   Temp 98.5 F (36.9 C) (Oral)   Resp 16   SpO2 100%   Physical Exam Vitals and nursing note reviewed.  Constitutional:      Appearance: She is not toxic-appearing.  HENT:     Head: Normocephalic and atraumatic.     Nose: Nose normal.     Mouth/Throat:     Mouth: Mucous membranes are moist.  Pharynx: Oropharynx is clear. Uvula midline. No oropharyngeal exudate or posterior oropharyngeal erythema.     Tonsils: No tonsillar exudate.  Eyes:     General: Lids are normal. Vision grossly intact.        Right eye: No discharge.        Left eye: No discharge.     Extraocular Movements: Extraocular movements intact.     Conjunctiva/sclera: Conjunctivae normal.     Pupils: Pupils are equal, round, and reactive to light.  Neck:     Trachea: Trachea and phonation normal.  Cardiovascular:     Rate and Rhythm: Normal rate and regular rhythm.     Pulses:          Dorsalis pedis pulses are 1+ on the right side and 1+ on the left side.     Heart sounds: Normal heart sounds. No murmur heard.    Comments: Faint DP pulses bilaterally, confirmed with  doppler.  Pulmonary:     Effort: Pulmonary effort is normal. No tachypnea, bradypnea, accessory muscle usage or respiratory distress.     Breath sounds: Normal breath sounds. No wheezing or rales.  Chest:     Chest wall: No mass, lacerations, deformity, swelling, tenderness, crepitus or edema.  Abdominal:     General: Bowel sounds are normal. There is no distension.     Palpations: Abdomen is soft.     Tenderness: There is no abdominal tenderness. There is no guarding or rebound.  Musculoskeletal:        General: No deformity.     Cervical back: Neck supple. No edema, rigidity or crepitus. No pain with movement.     Right lower leg: No edema.     Left lower leg: No edema.  Lymphadenopathy:     Cervical: No cervical adenopathy.  Skin:    General: Skin is warm and dry.     Capillary Refill: Capillary refill takes less than 2 seconds.     Comments: No color change of the left leg at this time.   Neurological:     General: No focal deficit present.     Mental Status: She is alert and oriented to person, place, and time. Mental status is at baseline.     Sensory: Sensation is intact.     Motor: Motor function is intact.     Gait: Gait is intact.  Psychiatric:        Mood and Affect: Mood normal.    ED Results / Procedures / Treatments   Labs (all labs ordered are listed, but only abnormal results are displayed) Labs Reviewed  BASIC METABOLIC PANEL - Abnormal; Notable for the following components:      Result Value   Glucose, Bld 105 (*)    All other components within normal limits  CBC WITH DIFFERENTIAL/PLATELET    EKG None  Radiology LE VENOUS  Result Date: 08/18/2021  Lower Venous DVT Study Patient Name:  Connie Duncan  Date of Exam:   08/18/2021 Medical Rec #: [id number]        Accession #:    [id number] Date of Birth: [birth year]       Patient Gender: F Patient Age:   18 years Exam Location:  [facility] Procedure:      VAS [place] LOWER EXTREMITY VENOUS (DVT)  Referring Phys: Nicki Reaper [place] --------------------------------------------------------------------------------  Indications: History of DVT, color changes to left leg, intermittent swelling.  Anticoagulation: Xarelto. Comparison Study: No prior studies available for review. Performing Technologist: Darlin Coco RDMS,  RVT  Examination Guidelines: A complete evaluation includes B-mode imaging, spectral Doppler, color Doppler, and power Doppler as needed of all accessible portions of each vessel. Bilateral testing is considered an integral part of a complete examination. Limited examinations for reoccurring indications may be performed as noted. The reflux portion of the exam is performed with the patient in reverse Trendelenburg.  +-----+---------------+---------+-----------+----------+--------------+ RIGHTCompressibilityPhasicitySpontaneityPropertiesThrombus Aging +-----+---------------+---------+-----------+----------+--------------+ CFV  Full           Yes      Yes                                 +-----+---------------+---------+-----------+----------+--------------+   +---------+---------------+---------+-----------+----------+-----------------+ LEFT     CompressibilityPhasicitySpontaneityPropertiesThrombus Aging    +---------+---------------+---------+-----------+----------+-----------------+ CFV      Full           Yes      Yes                                    +---------+---------------+---------+-----------+----------+-----------------+ SFJ      Full                                                           +---------+---------------+---------+-----------+----------+-----------------+ FV Prox  Full                                                           +---------+---------------+---------+-----------+----------+-----------------+ FV Mid   Full                                                            +---------+---------------+---------+-----------+----------+-----------------+ FV DistalFull                                                           +---------+---------------+---------+-----------+----------+-----------------+ PFV      Full                                                           +---------+---------------+---------+-----------+----------+-----------------+ POP      Partial        Yes      Yes                  Age Indeterminate +---------+---------------+---------+-----------+----------+-----------------+ PTV      Full                                                           +---------+---------------+---------+-----------+----------+-----------------+  PERO     Full                                                           +---------+---------------+---------+-----------+----------+-----------------+ Gastroc  Full                                                           +---------+---------------+---------+-----------+----------+-----------------+    Summary: RIGHT: - No evidence of common femoral vein obstruction.  LEFT: - There is no evidence of deep vein thrombosis in the lower extremity.  - No cystic structure found in the popliteal fossa.  *See table(s) above for measurements and observations. Electronically signed by Servando Snare MD on 08/18/2021 at 1:48:36 PM.    Final     Procedures Procedures   Medications Ordered in ED Medications - No data to display  ED Course  I have reviewed the triage vital signs and the nursing notes.  Pertinent labs & imaging results that were available during my care of the patient were reviewed by me and considered in my medical decision making (see chart for details).    MDM Rules/Calculators/A&P                         65 year old female who presents with reportedly abnormal DVT study today.   Hypertensive on intake, VS otherwise normal. Cardiopulmonary exam is normal, abdominal exam is benign.  Neurovascularly intact in all 4 extremities. Faint 1+ DP pulses bilaterally, confirmed with doppler.   Basic labs obtained from triage. CBC and BMP unremarkable. DVT study negative.   Patient is already anticoagulated on Xarelto.  Additionally neurovascular status is intact on exam the emergency department today.  While HPI is concerning for arterial anomaly, no concern for arterial deficit or ischemic limb today.  Do recommend she follows up in the outpatient setting with vascular surgeon. She may have outpatient arterial Doppler as indicated by vascular specialist.  No further work-up warranted in ED at this time.  Debbie voiced understanding of her medical evaluation and treatment plan.  Each of her questions was answered to her expressed satisfaction.  Strict return precautions are given.  Patient is well-appearing, stable, and appropriate for discharge at this time.  This chart was dictated using voice recognition software, Dragon. Despite the best efforts of this provider to proofread and correct errors, errors may still occur which can change documentation meaning.   Final Clinical Impression(s) / ED Diagnoses Final diagnoses:  None    Rx / DC Orders ED Discharge Orders     None        Aura Dials 08/20/21 1913    Lacretia Leigh, MD 08/21/21 [phone]

## 2021-08-18 NOTE — ED Triage Notes (Signed)
Pt reports left leg swelling and pain. Per PT, PCP told her to come to ER due to positive DVT study.

## 2021-08-18 NOTE — Discharge Instructions (Signed)
You were seen in the ER today for follow-up on your ultrasound.  There is good news that there is no blood clots noted on your venous ultrasound today.  Please call the vascular surgeon listed below for follow-up for further management of the symptoms you are having and return to the ER with any new pain in your leg, color change that does not resolve, or any other new severe symptom.

## 2021-08-24 ENCOUNTER — Ambulatory Visit (HOSPITAL_COMMUNITY)
Admission: RE | Admit: 2021-08-24 | Discharge: 2021-08-24 | Disposition: A | Discharge status: Home / Self Care | Payer: PPO | Source: Ambulatory Visit | Attending: Vascular Surgery | Admitting: Vascular Surgery

## 2021-08-24 ENCOUNTER — Encounter: Payer: Self-pay | Admitting: Vascular Surgery

## 2021-08-24 ENCOUNTER — Ambulatory Visit (INDEPENDENT_AMBULATORY_CARE_PROVIDER_SITE_OTHER): Payer: PPO | Admitting: Vascular Surgery

## 2021-08-24 ENCOUNTER — Other Ambulatory Visit: Payer: Self-pay

## 2021-08-24 VITALS — BP 128/96 | HR 90 | Temp 98.1°F | Resp 20 | Ht 68.0 in | Wt 181.9 lb

## 2021-08-24 DIAGNOSIS — M7989 Other specified soft tissue disorders: Secondary | ICD-10-CM

## 2021-08-24 DIAGNOSIS — I82412 Acute embolism and thrombosis of left femoral vein: Secondary | ICD-10-CM

## 2021-08-24 MED ORDER — ENOXAPARIN SODIUM 150 MG/ML IJ SOSY: 1.0000 mg/kg | PREFILLED_SYRINGE | Freq: Two times a day (BID) | INTRAMUSCULAR | Status: AC

## 2021-08-24 NOTE — Progress Notes (Signed)
Office Note     CC: Left lower extremity swelling and bluish discoloration of the toes Requesting Provider:  Myrlene Broker, MD  HPI: Connie Duncan is a 65 y.o. ([birth year]) female presenting at the request of .Myrlene Broker, MD for DVT follow-up.  In July of this year, Werden had a swollen left calf and presented to the hospital after DVT ultrasound study was positive.  Work-up also demonstrated pulmonary embolism.  She was started on Xarelto at that time.  Over the next 2 months, Duggan did well without any concerns, but appreciated waxing and waning calf swelling as well as a bluish discoloration at the toes.  After telling her PCP Dr. Unk Lightning, the patient was seen at [facility] due to concern for arterial involvement.  Patient was noted to have a palpable arterial pulse, and underwent venous duplex ultrasonography which noted chronic, partially occlusive, popliteal vein thrombus.  She presents today with her husband in follow-up.  Over the last week, [name] has had no new symptoms.  She continues to appreciate waxing and waning swelling that worsens after being on her feet for several hours.  She denies symptoms of claudication, rest pain, tissue loss.  She has tried compressions with good effect in the past, however is unsure if they were the proper fit.  She has no new symptoms of shortness of breath or chest pain but did describe a run of tachycardia last week that resolved after several minutes.  The pt is on a statin for cholesterol management.  The pt is on a daily aspirin.   Other AC:  xarelto The pt is not on medication for hypertension.   The pt is not diabetic.  Tobacco hx:  never  Past Medical History:  Diagnosis Date   Anxiety    Depression    DVT (deep venous thrombosis) (HCC)    High cholesterol    Hypertension    Pulmonary emboli ([place])     History reviewed. No pertinent surgical history.  Social History   Socioeconomic History   Marital  status: Married    Spouse name: Not on file   Number of children: Not on file   Years of education: Not on file   Highest education level: Not on file  Occupational History   Not on file  Tobacco Use   Smoking status: Never   Smokeless tobacco: Never  Vaping Use   Vaping Use: Never used  Substance and Sexual Activity   Alcohol use: Never   Drug use: Never   Sexual activity: Not on file  Other Topics Concern   Not on file  Social History Narrative   Not on file   Social Determinants of Health   Financial Resource Strain: Not on file  Food Insecurity: Not on file  Transportation Needs: Not on file  Physical Activity: Not on file  Stress: Not on file  Social Connections: Not on file  Intimate Partner Violence: Not on file   History reviewed. No pertinent family history.  Current Outpatient Medications  Medication Sig Dispense Refill   albuterol (VENTOLIN HFA) 108 (90 Base) MCG/ACT inhaler INHALE 2 PUFFS BY MOUTH EVERY 4 HOURS IFNEEDED     aspirin 81 MG EC tablet      Biotin 10 MG CAPS      Cholecalciferol 250 MCG (10000 UT) CAPS Take by mouth.     eletriptan (RELPAX) 40 MG tablet Take 40 mg by mouth daily as needed for migraine.     famotidine (  PEPCID) 40 MG tablet Take 40 mg by mouth daily.     furosemide (LASIX) 20 MG tablet TAKE ONE TABLET DAILY IN THE MORNING FORFLUID     gabapentin (NEURONTIN) 300 MG capsule Take 300 mg by mouth 3 (three) times daily.     lisinopril (ZESTRIL) 30 MG tablet Take 30 mg by mouth daily.     omeprazole (PRILOSEC) 20 MG capsule Take 20 mg by mouth daily.     rosuvastatin (CRESTOR) 20 MG tablet Take 20 mg by mouth at bedtime.     venlafaxine XR (EFFEXOR-XR) 150 MG 24 hr capsule      venlafaxine XR (EFFEXOR-XR) 75 MG 24 hr capsule Take 75 mg by mouth daily.     Current Facility-Administered Medications  Medication Dose Route Frequency Provider Last Rate Last Admin   enoxaparin (LOVENOX) injection 84 mg  1 mg/kg Subcutaneous Q12H Broadus John, MD        No Known Allergies   REVIEW OF SYSTEMS:   [X]  denotes positive finding, [ ]  denotes negative finding Cardiac  Comments:  Chest pain or chest pressure:    Shortness of breath upon exertion:    Short of breath when lying flat:    Irregular heart rhythm:        Vascular    Pain in calf, thigh, or hip brought on by ambulation:    Pain in feet at night that wakes you up from your sleep:     Blood clot in your veins:    Leg swelling:  X       Pulmonary    Oxygen at home:    Productive cough:     Wheezing:         Neurologic    Sudden weakness in arms or legs:     Sudden numbness in arms or legs:     Sudden onset of difficulty speaking or slurred speech:    Temporary loss of vision in one eye:     Problems with dizziness:         Gastrointestinal    Blood in stool:     Vomited blood:         Genitourinary    Burning when urinating:     Blood in urine:        Psychiatric    Major depression:         Hematologic    Bleeding problems:    Problems with blood clotting too easily:        Skin    Rashes or ulcers:        Constitutional    Fever or chills:      PHYSICAL EXAMINATION:  Vitals:   08/24/21 1601  BP: (!) 128/96  Pulse: 90  Resp: 20  Temp: 98.1 F (36.7 C)  SpO2: 94%  Weight: 181 lb 14.4 oz (82.5 kg)  Height: 5\' 8"  (1.727 m)    General:  WDWN in NAD; vital signs documented above Gait: Not observed HENT: WNL, normocephalic Pulmonary: normal non-labored breathing , without Rales, rhonchi,  wheezing Cardiac: regular HR, Abdomen: soft, NT, no masses Skin: without rashes Vascular Exam/Pulses:  Right Left  Radial 2+ (normal) 2+ (normal)  Ulnar 2+ (normal) 2+ (normal)  Femoral    Popliteal    DP 2+ (normal) 2+ (normal)  PT 1+ (weak) 1+ (weak)   Extremities: without ischemic changes, without Gangrene , without cellulitis; without open wounds;  Musculoskeletal: no muscle wasting or atrophy  Neurologic: A&O X  3;  No focal  weakness or paresthesias are detected Psychiatric:  The pt has Normal affect.   Non-Invasive Vascular Imaging:     Noninvasive vascular ultrasound was reviewed.  Initial presentation to [facility] in July of this year demonstrated distal femoral vein and popliteal vein thrombus.  On 08/18/2021, the patient underwent repeat left lower extremity duplex ultrasonography demonstrating chronic popliteal vein thrombus, no thrombus in the distal femoral vein  On 08/24/2021 the patient underwent repeat left lower extremity duplex ultrasonography in our office which demonstrated chronic popliteal vein thrombus, and age-indeterminate, nonocclusive thrombus in the distal femoral vein.    ASSESSMENT/PLAN: Connie Duncan is a 65 y.o. female presenting with known left lower extremity DVT with waxing and waning swelling and bluish discoloration at the toes.  There was no color change appreciated today in the office and the calf was soft. Latecia stated this was most apparent after working out.  We discussed that this is due to venous hypertension from the known left leg deep venous thrombosis.    The patient has had no new symptoms since her initial presentation to [facility] in July.  The ultrasound study conducted last week was done due to color changes at the toes and concerns for an arterial component.  There was none.  Jackelyn Poling has a palpable pulse in the foot, with no symptoms of claudication, rest pain, tissue loss.  Interestingly, repeat ultrasonography in our office today demonstrated distal femoral vein thrombus.  This is consistent with the initial imaging done at [facility], however this is inconsistent with the last ultrasound done which was at [facility] 7 days ago.  I cannot determine the age of the thrombus and therefore I must assume that it is new.  If thrombus propagation has occurred on Xarelto, this is a failure of the medication.  For safety, I transitioned the patient to twice  daily therapeutic dose Lovenox.  My office will work to set follow-up with hematology due to failure of anticoagulation and unknown reason for the initial deep venous thrombosis, pulmonary embolism.  [place] was asked to call my office should any questions or concerns arise, and seek immediate medical attention if any shortness of breath occurs.  The episode of tachycardia described in her visit is concerning, as the patient stated it lasted for several minutes with a heart rate as high as 170 bpm.  She had a normal rate and rhythm in the office today, but would benefit from seeing Dr. Janace Litten or her cardiologist for further workup.   Broadus John, MD [facility] [phone]

## 2021-08-26 ENCOUNTER — Telehealth: Payer: Self-pay | Admitting: Oncology

## 2021-08-26 NOTE — Telephone Encounter (Signed)
Scheduled appt per 10/18 referral. Pt is aware of appt date and time.

## 2021-09-04 ENCOUNTER — Inpatient Hospital Stay: Payer: PPO

## 2021-09-04 ENCOUNTER — Inpatient Hospital Stay: Payer: PPO | Attending: Oncology | Admitting: Oncology

## 2021-09-04 ENCOUNTER — Other Ambulatory Visit: Payer: Self-pay | Admitting: Oncology

## 2021-09-04 DIAGNOSIS — Z7901 Long term (current) use of anticoagulants: Secondary | ICD-10-CM | POA: Diagnosis not present

## 2021-09-04 DIAGNOSIS — Z86718 Personal history of other venous thrombosis and embolism: Secondary | ICD-10-CM | POA: Diagnosis not present

## 2021-09-04 DIAGNOSIS — I82412 Acute embolism and thrombosis of left femoral vein: Secondary | ICD-10-CM

## 2021-09-04 DIAGNOSIS — R42 Dizziness and giddiness: Secondary | ICD-10-CM | POA: Diagnosis not present

## 2021-09-04 DIAGNOSIS — I82512 Chronic embolism and thrombosis of left femoral vein: Secondary | ICD-10-CM

## 2021-09-06 DIAGNOSIS — I82412 Acute embolism and thrombosis of left femoral vein: Secondary | ICD-10-CM

## 2021-09-06 HISTORY — DX: Acute embolism and thrombosis of left femoral vein: I82.412

## 2021-09-06 LAB — PROTEIN S, TOTAL AND FREE
Protein S Ag, Free: 124 % (ref 61–136)
Protein S Ag, Total: 120 % (ref 60–150)

## 2021-09-06 LAB — CARDIOLIPIN ANTIBODIES, IGG, IGM, IGA
Anticardiolipin IgA: <9 APL U/mL (ref 0–11)
Anticardiolipin IgG: <9 GPL U/mL (ref 0–14)
Anticardiolipin IgM: 12 MPL U/mL (ref 0–12)

## 2021-09-06 LAB — ANTITHROMBIN PANEL
AT III AG PPP IMM-ACNC: 83 % (ref 72–124)
Antithrombin Activity: 109 % (ref 75–135)

## 2021-09-06 LAB — PROTEIN C ACTIVITY: Protein C Activity: 166 % (ref 73–180)

## 2021-09-06 NOTE — Progress Notes (Signed)
[facility]  [address] [place],  [place]  [address] [phone]  Clinic Day:  09/04/2021  Referring physician: Myrlene Broker, MD   HISTORY OF PRESENT ILLNESS:  The patient is a 65 y.o. female  I was asked to consult upon for evaluation of a new DVT forming while on anticoagulation with Xarelto.  Her history dates back to July 2022 when she first began having left calf pain.  This pain became progressively worse over weeks to where she finally sought attention.  A Doppler ultrasound done showed an acute DVT in her left distal femoral and popliteal veins.  As she was reporting some dizziness, a CT angiogram was also done, which revealed bilateral pulmonary emboli.  This led to her being placed on Xarelto.  The patient was doing okay until October 2022 when she began having bluish discoloration in her left foot.  Apparently, her arterial pulses were satisfactory.  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  As she was still having issues with her left leg, a repeat Doppler ultrasound was down a week later, which revealed a clot in her femoral vein.  Although a clot was seen in this vein in July, it was apparently not appreciated on the Doppler ultrasound done just a week ago.  Therefore, it was assumed to be a new DVT in the setting of the patient being compliant with Xarelto.  She has since been switched to Lovenox shots, which she takes twice daily.  Of note, the patient denies having any type of recent trauma to her left leg/knee.  She had left knee replacement in 2015, but never had any peri/postoperative complications. She also had an episode of superficial phlebitis 40 years ago, which was attributed to her being on oral contraception.  To her knowledge, there is no obvious family history of blood clot.  Her mother died from an embolic stroke, which was attributed to her atrial fibrillation.    PAST  MEDICAL HISTORY:   Past Medical History:  Diagnosis Date   Anxiety    Depression    DVT (deep venous thrombosis) (HCC)    High cholesterol    Hypertension    Pulmonary emboli (HCC)     PAST SURGICAL HISTORY:  Left knee replacement Tonsilletomy Appendectomy Cholecystectomy Bilateral tubal ligation Back surgery Anal fissure surgery  CURRENT MEDICATIONS:   Current Outpatient Medications  Medication Sig Dispense Refill   albuterol (VENTOLIN HFA) 108 (90 Base) MCG/ACT inhaler INHALE 2 PUFFS BY MOUTH EVERY 4 HOURS IFNEEDED     aspirin 81 MG EC tablet      Biotin 10 MG CAPS      Cholecalciferol 250 MCG (10000 UT) CAPS Take by mouth.     eletriptan (RELPAX) 40 MG tablet Take 40 mg by mouth daily as needed for migraine.     famotidine (PEPCID) 40 MG tablet Take 40 mg by mouth daily.     furosemide (LASIX) 20 MG tablet TAKE ONE TABLET DAILY IN THE MORNING FORFLUID     gabapentin (NEURONTIN) 300 MG capsule Take 300 mg by mouth 3 (three) times daily.     lisinopril (ZESTRIL) 30 MG tablet Take 30 mg by mouth daily.     omeprazole (PRILOSEC) 20 MG capsule Take 20 mg by mouth daily.     rosuvastatin (CRESTOR) 20 MG tablet Take 20 mg by mouth at bedtime.     venlafaxine XR (EFFEXOR-XR) 150 MG 24  hr capsule      venlafaxine XR (EFFEXOR-XR) 75 MG 24 hr capsule Take 75 mg by mouth daily.     Current Facility-Administered Medications  Medication Dose Route Frequency Provider Last Rate Last Admin   enoxaparin (LOVENOX) injection 84 mg  1 mg/kg Subcutaneous Q12H Broadus John, MD        ALLERGIES:  No Known Allergies  FAMILY HISTORY:  Father died from lung cancer Mother died from embolic stroke from atrial fibrillation  SOCIAL HISTORY:  The patient was born in [place].  She lives in [place] with her husband of 53 years.  They have 2 children, 3 grandchildren and 1 great grandchild.  She worked in [employer] system for nearly 30 years.  There is no history of  alcoholism or tobacco abuse.    REVIEW OF SYSTEMS:  Review of Systems  Constitutional:  Positive for fatigue. Negative for fever.  HENT:   Negative for hearing loss and sore throat.   Eyes:  Negative for eye problems.  Respiratory:  Negative for chest tightness, cough and hemoptysis.   Cardiovascular:  Positive for palpitations. Negative for chest pain.  Gastrointestinal:  Positive for nausea. Negative for abdominal distention, abdominal pain, blood in stool, constipation, diarrhea and vomiting.  Endocrine: Negative for hot flashes.  Genitourinary:  Negative for difficulty urinating, dysuria, frequency, hematuria and nocturia.   Musculoskeletal:  Positive for back pain. Negative for arthralgias, gait problem and myalgias.  Skin: Negative.  Negative for itching and rash.  Neurological: Negative.  Negative for dizziness, extremity weakness, gait problem, headaches, light-headedness and numbness.  Hematological: Negative.   Psychiatric/Behavioral:  Positive for depression. Negative for suicidal ideas. The patient is not nervous/anxious.     PHYSICAL EXAM:  Blood pressure 133/82, pulse (!) 104, temperature 99.2 F (37.3 C), resp. rate 16, height 5\' 8"  (1.727 m), weight 178 lb 4.8 oz (80.9 kg), SpO2 98 %. Wt Readings from Last 3 Encounters:  09/04/21 178 lb 4.8 oz (80.9 kg)  08/24/21 181 lb 14.4 oz (82.5 kg)  08/17/21 181 lb (82.1 kg)   Body mass index is 27.11 kg/m. Performance status (ECOG): 1 - Symptomatic but completely ambulatory Physical Exam Constitutional:      Appearance: Normal appearance. She is not ill-appearing.  HENT:     Mouth/Throat:     Mouth: Mucous membranes are moist.     Pharynx: Oropharynx is clear. No oropharyngeal exudate or posterior oropharyngeal erythema.  Cardiovascular:     Rate and Rhythm: Normal rate and regular rhythm.     Heart sounds: No murmur heard.   No friction rub. No gallop.     Comments: Weak, but present, dorsalis pedal pulses Pulmonary:      Effort: Pulmonary effort is normal. No respiratory distress.     Breath sounds: Normal breath sounds. No wheezing, rhonchi or rales.  Abdominal:     General: Bowel sounds are normal. There is no distension.     Palpations: Abdomen is soft. There is no mass.     Tenderness: There is no abdominal tenderness.  Musculoskeletal:        General: No swelling.     Right lower leg: No edema.     Left lower leg: No edema.  Lymphadenopathy:     Cervical: No cervical adenopathy.     Upper Body:     Right upper body: No supraclavicular or axillary adenopathy.     Left upper body: No supraclavicular or axillary adenopathy.  Lower Body: No right inguinal adenopathy. No left inguinal adenopathy.  Skin:    General: Skin is warm.     Coloration: Skin is not jaundiced.     Findings: No lesion or rash.     Comments: Progressive bluish discoloration of distal feet, left greater than right  Neurological:     General: No focal deficit present.     Mental Status: She is alert and oriented to person, place, and time. Mental status is at baseline.  Psychiatric:        Mood and Affect: Mood normal.        Behavior: Behavior normal.        Thought Content: Thought content normal.    LABS:   CBC Latest Ref Rng & Units 08/18/2021 08/17/2021 10/22/2010  WBC 4.0 - 10.5 K/uL 7.4 10.4 10.7(H)  Hemoglobin 12.0 - 15.0 g/dL 12.6 13.2 12.7  Hematocrit 36.0 - 46.0 % 39.0 40.6 38.8  Platelets 150 - 400 K/uL 285 325 317   CMP Latest Ref Rng & Units 08/18/2021 08/17/2021 10/22/2010  Glucose 70 - 99 mg/dL 105(H) 89 94  BUN 8 - 23 mg/dL 16 15 7   Creatinine 0.44 - 1.00 mg/dL 0.85 0.95 0.82  Sodium 135 - 145 mmol/L 139 140 138  Potassium 3.5 - 5.1 mmol/L 4.3 4.1 3.3(L)  Chloride 98 - 111 mmol/L 104 106 99  CO2 22 - 32 mmol/L 28 26 30   Calcium 8.9 - 10.3 mg/dL 10.2 10.1 9.3  Total Protein 6.5 - 8.1 g/dL - 6.9 -  Total Bilirubin 0.3 - 1.2 mg/dL - 0.5 -  Alkaline Phos 38 - 126 U/L - 75 -  AST 15 - 41 U/L -  14(L) -  ALT 0 - 44 U/L - 9 -   STUDIES:  LE VENOUS  Result Date: 08/19/2021  Lower Venous DVT Study Patient Name:  Connie Duncan  Date of Exam:   08/18/2021 Medical Rec #: [id number]        Accession #:    [id number] Date of Birth: [birth year]       Patient Gender: F Patient Age:   31 years Exam Location[facility]  [facility] Procedure:      VAS [place] LOWER EXTREMITY VENOUS (DVT) Referring Phys: Nicki Reaper [place] --------------------------------------------------------------------------------  Indications: History of DVT, color changes to left leg, intermittent swelling.  Anticoagulation: Xarelto. Comparison Study: No prior studies available for review. Performing Technologist: Darlin Coco RDMS, RVT  Examination Guidelines: A complete evaluation includes B-mode imaging, spectral Doppler, color Doppler, and power Doppler as needed of all accessible portions of each vessel. Bilateral testing is considered an integral part of a complete examination. Limited examinations for reoccurring indications may be performed as noted. The reflux portion of the exam is performed with the patient in reverse Trendelenburg.  +-----+---------------+---------+-----------+----------+--------------+ RIGHTCompressibilityPhasicitySpontaneityPropertiesThrombus Aging +-----+---------------+---------+-----------+----------+--------------+ CFV  Full           Yes      Yes                                 +-----+---------------+---------+-----------+----------+--------------+   +---------+---------------+---------+-----------+----------+-----------------+ LEFT     CompressibilityPhasicitySpontaneityPropertiesThrombus Aging    +---------+---------------+---------+-----------+----------+-----------------+ CFV      Full           Yes      Yes                                    +---------+---------------+---------+-----------+----------+-----------------+  SFJ      Full                                                            +---------+---------------+---------+-----------+----------+-----------------+ FV Prox  Full                                                           +---------+---------------+---------+-----------+----------+-----------------+ FV Mid   Full                                                           +---------+---------------+---------+-----------+----------+-----------------+ FV DistalFull                                                           +---------+---------------+---------+-----------+----------+-----------------+ PFV      Full                                                           +---------+---------------+---------+-----------+----------+-----------------+ POP      Partial        Yes      Yes                  Age Indeterminate +---------+---------------+---------+-----------+----------+-----------------+ PTV      Full                                                           +---------+---------------+---------+-----------+----------+-----------------+ PERO     Full                                                           +---------+---------------+---------+-----------+----------+-----------------+ Gastroc  Full                                                           +---------+---------------+---------+-----------+----------+-----------------+    Summary: RIGHT: - No evidence of common femoral vein obstruction.  LEFT: - Findings consistent with age indeterminate deep vein thrombosis involving the left popliteal vein. - No cystic structure found in the popliteal fossa.  *See table(s) above for measurements and  observations. Electronically signed by Servando Snare MD on 08/18/2021 at 1:48:36 PM.    Final (Updated)    VAS [place] LOWER EXTREMITY VENOUS REFLUX  Result Date: 08/24/2021  Lower Venous Reflux Study Patient Name:  Connie Duncan  Date of Exam:   08/24/2021 Medical Rec #: [id number]        Accession #:    [id number]  Date of Birth: 1956/08/08       Patient Gender: F Patient Age:   65 years Exam Location:  [facility] Procedure:      VAS [place] LOWER EXTREMITY VENOUS REFLUX Referring Phys: Vonna Kotyk ROBINS --------------------------------------------------------------------------------  Indications: Swelling left lower extremity. Other Indications: Partial left popliteal DVT per exam 08/18/2021. Performing Technologist: Alvia Grove RVT  Examination Guidelines: A complete evaluation includes B-mode imaging, spectral Doppler, color Doppler, and power Doppler as needed of all accessible portions of each vessel. Bilateral testing is considered an integral part of a complete examination. Limited examinations for reoccurring indications may be performed as noted. The reflux portion of the exam is performed with the patient in reverse Trendelenburg. Significant venous reflux is defined as >500 ms in the superficial venous system, and >1 second in the deep venous system.  +--------------+---------+------+-----------+------------+--------+ LEFT          Reflux NoRefluxReflux TimeDiameter cmsComments                         Yes                                  +--------------+---------+------+-----------+------------+--------+ CFV                     yes   >1 second                      +--------------+---------+------+-----------+------------+--------+ FV dist       no                                    DVT      +--------------+---------+------+-----------+------------+--------+ GSV at SFJ              yes    >500 ms      0.62             +--------------+---------+------+-----------+------------+--------+ GSV prox thighno                            0.74             +--------------+---------+------+-----------+------------+--------+ GSV mid thigh no                            0.43             +--------------+---------+------+-----------+------------+--------+ GSV dist thighno                             0.41             +--------------+---------+------+-----------+------------+--------+ GSV at knee   no                            0.46             +--------------+---------+------+-----------+------------+--------+  GSV prox calf no                            0.40             +--------------+---------+------+-----------+------------+--------+ GSV mid calf  no                            0.31             +--------------+---------+------+-----------+------------+--------+ SSV Pop Fossa no                            0.34             +--------------+---------+------+-----------+------------+--------+ SSV prox calf no                            0.35             +--------------+---------+------+-----------+------------+--------+ SSV mid calf  no                                             +--------------+---------+------+-----------+------------+--------+   Summary: Left: - Color duplex evaluation of the left lower extremity shows there is thrombus in the popliteal vein and distal femoral vein. - Venous reflux is noted in the left common femoral vein. - Venous reflux is noted in the left sapheno-femoral junction.  *See table(s) above for measurements and observations. Electronically signed by Orlie Pollen on 08/24/2021 at 7:06:02 PM.    Final      ASSESSMENT & PLAN:  A 65 y.o. 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  As mentioned previously, the patient is now on Lovenox.  Although there is no family history, her recent clotting history in the setting of anticoagulation compliance has me slightly concerned she may have an underlying clotting disorder.  I will check her for the following clotting disorders:  Protein C deficiency, protein S deficiency, factor V Leiden mutation, prothrombin gene mutation, antithrombin deficiency, and antiphospholipid syndrome.  Of note, her most recent  Doppler ultrasound did reveal venous reflux; this is concerning for vascular damage being present to where she may chronically be at a high risk for developing a DVT in her left leg.  She knows to continue taking her Lovenox.  I will see her back in 3 weeks to go over her hypercoagulable workup and its implications.  The patient understands all the plans discussed today and is in agreement with them.  I do appreciate Myrlene Broker, MD for his new consult.   Alim Cattell Macarthur Critchley, MD

## 2021-09-07 LAB — LUPUS ANTICOAGULANT
DRVVT: 43.2 s (ref 0.0–47.0)
PTT Lupus Anticoagulant: 50.2 s (ref 0.0–51.9)
Thrombin Time: 26.7 s — ABNORMAL HIGH (ref 0.0–23.0)
dPT Confirm Ratio: 0.9 Ratio (ref 0.00–1.34)
dPT: 43.8 s (ref 0.0–47.6)

## 2021-09-07 LAB — BETA-2-GLYCOPROTEIN I ABS, IGG/M/A
Beta-2 Glyco I IgG: <9 GPI IgG units (ref 0–20)
Beta-2-Glycoprotein I IgA: <9 GPI IgA units (ref 0–25)
Beta-2-Glycoprotein I IgM: 9 GPI IgM units (ref 0–32)

## 2021-09-09 LAB — PROTEIN C, TOTAL: Protein C, Total: 149 % (ref 60–150)

## 2021-09-11 LAB — PROTHROMBIN GENE MUTATION

## 2021-09-18 NOTE — Progress Notes (Signed)
[facility]  [address] [place],  [place]  [address] [phone]  Clinic Day:  09/25/2021  Referring physician: Myrlene Broker, MD  This document serves as a record of services personally performed by Dequincy Macarthur Critchley, MD. It was created on their behalf by [facility] E, a trained medical scribe. The creation of this record is based on the scribe's personal observations and the provider's statements to them.  HISTORY OF PRESENT ILLNESS:  The patient is a 65 y.o. female who I recently began seeing for evaluation of a new DVT forming while on anticoagulation with Xarelto.  This patient also has had bilateral pulmonary emboli.  Due to her initial DVTs being unprovoked and also developing blood clots while on Xarelto, a hypercoagulable work-up was done to ensure no underlying clotting disorder is present.  She comes in today to go over these results and their implications.  Since her last visit, the patient has been doing okay.  She continues to take Lovenox twice daily to prevent additional blood clots from forming.  She denies having any new swelling in her legs or pleuritic chest pain which concerns her for recurrent pulmonary emboli having developed.   PHYSICAL EXAM:  Blood pressure 116/81, pulse (!) 106, temperature 98.5 F (36.9 C), resp. rate 16, height 5\' 8"  (1.727 m), weight 180 lb 6.4 oz (81.8 kg), SpO2 98 %. Wt Readings from Last 3 Encounters:  09/25/21 180 lb 6.4 oz (81.8 kg)  09/04/21 178 lb 4.8 oz (80.9 kg)  08/24/21 181 lb 14.4 oz (82.5 kg)   Body mass index is 27.43 kg/m. Performance status (ECOG): 1 - Symptomatic but completely ambulatory Physical Exam Constitutional:      Appearance: Normal appearance. She is not ill-appearing.  HENT:     Mouth/Throat:     Mouth: Mucous membranes are moist.     Pharynx: Oropharynx is clear. No oropharyngeal exudate or posterior oropharyngeal erythema.  Cardiovascular:     Rate and Rhythm:  Normal rate and regular rhythm.     Heart sounds: No murmur heard.   No friction rub. No gallop.  Pulmonary:     Effort: Pulmonary effort is normal. No respiratory distress.     Breath sounds: Normal breath sounds. No wheezing, rhonchi or rales.  Abdominal:     General: Bowel sounds are normal. There is no distension.     Palpations: Abdomen is soft. There is no mass.     Tenderness: There is no abdominal tenderness.  Musculoskeletal:        General: No swelling.     Right lower leg: No edema.     Left lower leg: No edema.  Lymphadenopathy:     Cervical: No cervical adenopathy.     Upper Body:     Right upper body: No supraclavicular or axillary adenopathy.     Left upper body: No supraclavicular or axillary adenopathy.     Lower Body: No right inguinal adenopathy. No left inguinal adenopathy.  Skin:    General: Skin is warm.     Coloration: Skin is not jaundiced.     Findings: No lesion or rash.     Comments: Progressive bluish discoloration of distal feet, left greater than right  Neurological:     General: No focal deficit present.     Mental Status: She is alert and oriented to person, place, and time. Mental status is at baseline.  Psychiatric:        Mood and Affect: Mood  normal.        Behavior: Behavior normal.        Thought Content: Thought content normal.    LABS:    Latest Reference Range & Units 09/04/21 14:09 09/04/21 15:37 09/04/21 15:46  Anticardiolipin Ab,IgA,Qn 0 - 11 APL U/mL <9    Anticardiolipin Ab,IgG,Qn 0 - 14 GPL U/mL <9    Anticardiolipin Ab,IgM,Qn 0 - 12 MPL U/mL 12    PTT Lupus Anticoagulant 0.0 - 51.9 sec 50.2    DRVVT 0.0 - 47.0 sec 43.2    Lupus Anticoag Interp  Comment: No lupus anticoagulant detected    Beta-2 Glycoprotein I Ab, IgG 0 - 20 GPI IgG units <9    Beta-2-Glycoprotein I IgA 0 - 25 GPI IgA units <9    Beta-2-Glycoprotein I IgM 0 - 32 GPI IgM units 9    Antithrombin Activity 75 - 135 %   109  AT III AG PPP IMM-ACNC 72 - 124 %    83  Recommendations-PTGENE:  Comment:  No mutation identified    dPT 0.0 - 47.6 sec 43.8    dPT Confirm Ratio 0.00 - 1.34 Ratio 0.90    Protein C-Functional 73 - 180 % 166    Protein C, Total 60 - 150 % 149    Protein S, Free 61 - 136 %  124   Protein S, Total 60 - 150 %  120     ASSESSMENT & PLAN:  A 65 y.o. 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  Her initial DVT and pulmonary emboli also developed in an unprovoked manner.  In clinic today, I went over all of her hypercoagulable work-up with her, for which she could see she tested negative for virtually all clotting disorders, including protein C deficiency, protein S deficiency, prothrombin gene mutation, antithrombin deficiency, and antiphospholipid syndrome.  Her factor V Leiden mutation status was also attempted to be tested, but it was not approved by her insurance.  Moving forward, the patient understands she will need to be on lifelong anticoagulation.  I at least want her to remain on Lovenox for the next 4-6 months.  Afterwards, the decision could be made to switch her back to an oral anticoagulant, such as Eliquis.  Clinically, the patient appears to be doing well.  As she has no other pressing hematologic issues, I do feel comfortable turning her care back over to her other physicians.  I would not have a problem seeing her in the future if new hematologic/oncologic issues arise that require repeat clinical assessment.  The patient understands all the plans discussed today and is in agreement with them.  I, Rita Ohara, am acting as scribe for Marice Potter, MD    I have reviewed this report as typed by the medical scribe, and it is complete and accurate.  Dequincy Macarthur Critchley, MD

## 2021-09-25 ENCOUNTER — Other Ambulatory Visit: Payer: Self-pay

## 2021-09-25 ENCOUNTER — Other Ambulatory Visit: Payer: Self-pay | Admitting: Oncology

## 2021-09-25 ENCOUNTER — Inpatient Hospital Stay: Payer: PPO | Attending: Oncology | Admitting: Oncology

## 2021-09-25 VITALS — BP 116/81 | HR 106 | Temp 98.5°F | Resp 16 | Ht 68.0 in | Wt 180.4 lb

## 2021-09-25 DIAGNOSIS — I82412 Acute embolism and thrombosis of left femoral vein: Secondary | ICD-10-CM

## 2021-09-25 MED ORDER — LOVENOX 80 MG/0.8ML IJ SOSY: PREFILLED_SYRINGE | INTRAMUSCULAR | 5 refills | Status: DC

## 2021-09-28 ENCOUNTER — Other Ambulatory Visit: Payer: Self-pay | Admitting: Oncology

## 2021-09-28 MED ORDER — LOVENOX 80 MG/0.8ML IJ SOSY: PREFILLED_SYRINGE | INTRAMUSCULAR | 5 refills | Status: DC

## 2021-09-28 MED ORDER — LOVENOX 80 MG/0.8ML IJ SOSY: PREFILLED_SYRINGE | INTRAMUSCULAR | 5 refills | Status: AC

## 2022-01-22 ENCOUNTER — Encounter: Payer: Self-pay | Admitting: Cardiology

## 2022-01-22 ENCOUNTER — Encounter: Payer: Self-pay | Admitting: *Deleted

## 2022-01-22 DIAGNOSIS — M5134 Other intervertebral disc degeneration, thoracic region: Secondary | ICD-10-CM | POA: Insufficient documentation

## 2022-02-25 NOTE — Progress Notes (Signed)
?Cardiology Office Note:   ? ?Date:  02/26/2022  ? ?ID:  Connie Duncan, DOB Jan 28, 1956, MRN [id number] ? ?PCP:  Myrlene Broker, MD  ?Cardiologist:  Shirlee More, MD  ? ?Referring MD: Myrlene Broker, MD ? ?ASSESSMENT:   ? ?1. Essential hypertension   ?2. Deep vein thrombosis (DVT) of femoral vein of left lower extremity, unspecified chronicity ([place])   ?3. Chronic anticoagulation   ? ?PLAN:   ? ?In order of problems listed above: ? ?She has essential hypertension poorly controlled improved since the addition of a calcium channel blocker I would continue to trend at this time check renal vascular duplex and additional therapy as needed MRA.  I like to see her diastolics consistently less than 90.  Asked her to fully salt restrict ?Obviously hypercoagulable anticoagulation managed by hematology ? ?Next appointment the other guy get out of here ? ?If 3 months ? ? ?Medication Adjustments/Labs and Tests Ordered: ?Current medicines are reviewed at length with the patient today.  Concerns regarding medicines are outlined above.  ?Orders Placed This Encounter  ?Procedures  ? EKG 12-Lead  ? VAS US RENAL ARTERY DUPLEX  ? ?Meds ordered this encounter  ?Medications  ? telmisartan (MICARDIS) 40 MG tablet  ?  Sig: Take 1 tablet (40 mg total) by mouth daily.  ?  Dispense:  90 tablet  ?  Refill:  3  ?  ? ?Chief Complaint  ?Patient presents with  ? Hypertension  ? ? ?History of Present Illness:   ? ?Connie Duncan is a 66 y.o. female with a history of hypertension hyperlipidemia venous thromboembolism with previous DVT and pulmonary embolism who is being seen today for the evaluation of high blood pressure at the request of Myrlene Broker, MD. ? ?An echocardiogram at [facility] 05/19/2021 left ventricle normal in size wall thickness EF 55 to 60% right ventricle normal size and function both atria were normal in size there is trace mitral and aortic regurgitation seen. ? ?She had an EKG at [facility] 01/10/2022  independently reviewed showing sinus rhythm normal EKG she is seen in the emergency room same date for elevated blood pressure and headache.  Blood pressure recorded 166/95 CBC showed mild anemia hemoglobin 11.9 platelets 01/15/1999 creatinine 0.7 potassium 3.5 she was taking lisinopril and was started on metoprolol. ? ?She has been to [facility] in July 2022 with DVT and pulmonary embolism with a description of left lower extremity showing thrombus in the left popliteal and femoral veins shortness of breath underwent CTA showing bilateral pulmonary emboli with borderline right heart strain.  She was treated with Xarelto.  Her echocardiogram did not show RV dysfunction proBNP level was low at 66 and troponin was normal ? ?She has a history of hypertension dating back to 51.  She said she was seen by my partner's many years ago does not think she had any evaluation for hypertension but had a nuclear perfusion study she was told was normal ?Her blood pressures been well controlled ?Recently her numbers been greater than 150-160/100 resulting in emergency room visit ?She was put on a beta-blocker she felt badly her heart rate was in the 50s stopped and put on calcium channel blocker amlodipine ?Subsequent had symptomatic hypotension with blood pressures less than 90 systolic ?Her son and pharmacist split the medications or morning and evening and since then her systolic is less than 322 and 025 but her diastolics remain in the low 90s although at times is less than 90  and is sequentially improving ?She remains on long-term anticoagulation with Lovenox directed by hematology ?She does not fully sodium restrict ?She has no history of toxemia of pregnancy or kidney disease ?She is not having edema shortness of breath chest pain palpitation or syncope. ? ?Her home blood pressure is now settled in the range of 120s to 130s over high 80s to low 90s. ?We did simultaneous BP measurements and we were 6 mm higher in my  office ?Past Medical History:  ?Diagnosis Date  ? Acute saddle pulmonary embolism without acute cor pulmonale ([place]) 05/26/2021  ? Allergic rhinitis 12/25/2015  ? Asthma, mild intermittent 12/25/2015  ? Chronic bilateral low back pain 09/17/2013  ? Chronic fatigue 12/25/2015  ? Chronic superficial gastritis without bleeding 03/17/2018  ? DDD (degenerative disc disease), lumbar 12/25/2015  ? Depression, major, in remission ([place]) 12/25/2015  ? DVT (deep venous thrombosis) ([place])   ? Essential hypertension 12/25/2015  ? Extremity cyanosis 08/17/2021  ? GAD (generalized anxiety disorder) 12/25/2015  ? GERD (gastroesophageal reflux disease) 12/25/2015  ? Heart palpitations 01/19/2021  ? High cholesterol   ? Hypercalcemia 02/22/2017  ? Hypertension   ? IBS (irritable bowel syndrome) 12/25/2015  ? Left femoral vein DVT ([place]) 09/06/2021  ? Lumbosacral spondylosis without myelopathy 09/17/2013  ? Migraine syndrome 07/12/2017  ? Mixed dyslipidemia 05/29/2020  ? Obesity (BMI 30-39.9) 12/25/2015  ? OSA (obstructive sleep apnea) 12/25/2015  ? Pulmonary emboli ([place])   ? Swelling of left lower extremity 08/17/2021  ? Vitamin D deficiency 02/22/2017  ? Voiding dysfunction 12/25/2015  ? ? ?Past Surgical History:  ?Procedure Laterality Date  ? APPENDECTOMY    ? BACK SURGERY    ? CHOLECYSTECTOMY    ? ESOPHAGOGASTRODUODENOSCOPY    ? Dr. Melina Copa  ? TOTAL KNEE ARTHROPLASTY Left   ? TUBAL LIGATION    ? ? ?Current Medications: ?Current Meds  ?Medication Sig  ? amLODipine (NORVASC) 5 MG tablet Take 1 tablet by mouth daily.  ? Cholecalciferol 250 MCG (10000 UT) CAPS Take 1 capsule by mouth daily.  ? eletriptan (RELPAX) 40 MG tablet Take 40 mg by mouth daily as needed for migraine.  ? famotidine (PEPCID) 40 MG tablet Take 40 mg by mouth daily.  ? fluticasone (FLONASE) 50 MCG/ACT nasal spray Place 1 spray into both nostrils daily.  ? furosemide (LASIX) 20 MG tablet TAKE ONE TABLET DAILY IN THE MORNING FORFLUID  ? gabapentin (NEURONTIN) 300 MG  capsule Take 300 mg by mouth 3 (three) times daily.  ? LOVENOX 80 MG/0.8ML injection 80 mg SQ BID Dispense 60 prefilled syringes for 1 month supply  ? omeprazole (PRILOSEC) 20 MG capsule Take 20 mg by mouth daily.  ? rosuvastatin (CRESTOR) 20 MG tablet Take 20 mg by mouth at bedtime.  ? telmisartan (MICARDIS) 40 MG tablet Take 1 tablet (40 mg total) by mouth daily.  ? venlafaxine XR (EFFEXOR-XR) 150 MG 24 hr capsule 150 mg daily with breakfast.  ? [DISCONTINUED] lisinopril (ZESTRIL) 40 MG tablet Take 1 tablet by mouth daily.  ?  ? ?Allergies:   Patient has no known allergies.  ? ?Social History  ? ?Socioeconomic History  ? Marital status: Married  ?  Spouse name: Not on file  ? Number of children: Not on file  ? Years of education: Not on file  ? Highest education level: Not on file  ?Occupational History  ? Not on file  ?Tobacco Use  ? Smoking status: Never  ? Smokeless tobacco: Never  ?Vaping Use  ?  Vaping Use: Never used  ?Substance and Sexual Activity  ? Alcohol use: Never  ? Drug use: Never  ? Sexual activity: Not on file  ?Other Topics Concern  ? Not on file  ?Social History Narrative  ? Not on file  ? ?Social Determinants of Health  ? ?Financial Resource Strain: Not on file  ?Food Insecurity: Not on file  ?Transportation Needs: Not on file  ?Physical Activity: Not on file  ?Stress: Not on file  ?Social Connections: Not on file  ?  ? ?Family History: ?The patient's family history includes COPD in her brother; Colon cancer in her maternal aunt; Diabetes in her mother; Heart attack in her mother; Lung cancer in her father; Stroke in her mother. ? ?ROS:   ?ROS Please see the history of present illness.    ? All other systems reviewed and are negative. ? ?EKGs/Labs/Other Studies Reviewed:   ? ?The following studies were reviewed today: ? ? ?EKG:  EKG is  ordered today.  The ekg ordered today is personally reviewed and demonstrates sinus rhythm insignificant inferior Q waves R wave progression ?Recent  Labs: ?08/17/2021: ALT 9 ?08/18/2021: BUN 16; Creatinine, Ser 0.85; Hemoglobin 12.6; Platelets 285; Potassium 4.3; Sodium 139  ? ? ?Physical Exam:   ? ?VS:  BP 96/66   Pulse 98   Ht '5\' 8"'$  (1.727 m)   Wt 172 lb 12.8 oz (78

## 2022-02-26 ENCOUNTER — Encounter: Payer: Self-pay | Admitting: Cardiology

## 2022-02-26 ENCOUNTER — Ambulatory Visit (INDEPENDENT_AMBULATORY_CARE_PROVIDER_SITE_OTHER): Payer: PPO | Admitting: Cardiology

## 2022-02-26 VITALS — BP 96/66 | HR 98 | Ht 68.0 in | Wt 172.8 lb

## 2022-02-26 DIAGNOSIS — I10 Essential (primary) hypertension: Secondary | ICD-10-CM

## 2022-02-26 DIAGNOSIS — Z7901 Long term (current) use of anticoagulants: Secondary | ICD-10-CM | POA: Diagnosis not present

## 2022-02-26 DIAGNOSIS — I82412 Acute embolism and thrombosis of left femoral vein: Secondary | ICD-10-CM

## 2022-02-26 MED ORDER — TELMISARTAN 40 MG PO TABS: 40.0000 mg | ORAL_TABLET | Freq: Every day | ORAL | 3 refills | Status: DC

## 2022-02-26 NOTE — Patient Instructions (Signed)
Medication Instructions:  ?Your physician has recommended you make the following change in your medication:  ? ?START: Telmisartan 40 mg day ?STOP: Lisinopril  ? ?*If you need a refill on your cardiac medications before your next appointment, please call your pharmacy* ? ? ?Lab Work: ?None ?If you have labs (blood work) drawn today and your tests are completely normal, you will receive your results only by: ?MyChart Message (if you have MyChart) OR ?A paper copy in the mail ?If you have any lab test that is abnormal or we need to change your treatment, we will call you to review the results. ? ? ?Testing/Procedures: ?Your physician has requested that you have a renal artery duplex. During this test, an ultrasound is used to evaluate blood flow to the kidneys. Allow one hour for this exam. Do not eat after midnight the day before and avoid carbonated beverages. Take your medications as you usually do.  ? ? ?Follow-Up: ?At [facility], you and your health needs are our priority.  As part of our continuing mission to provide you with exceptional heart care, we have created designated Provider Care Teams.  These Care Teams include your primary Cardiologist (physician) and Advanced Practice Providers (APPs -  Physician Assistants and Nurse Practitioners) who all work together to provide you with the care you need, when you need it. ? ?We recommend signing up for the patient portal called "MyChart".  Sign up information is provided on this After Visit Summary.  MyChart is used to connect with patients for Virtual Visits (Telemedicine).  Patients are able to view lab/test results, encounter notes, upcoming appointments, etc.  Non-urgent messages can be sent to your provider as well.   ?To learn more about what you can do with MyChart, go to [email or web address].   ? ?Your next appointment:   ?2 month(s) ? ?The format for your next appointment:   ?In Person ? ?Provider:   ?Shirlee More, MD  ? ? ?Other  Instructions ?See list of blood pressures through MyChart ? ?Important Information About Sugar ? ? ? ? ? ? ?

## 2022-03-09 ENCOUNTER — Encounter: Payer: Self-pay | Admitting: Cardiology

## 2022-03-09 ENCOUNTER — Ambulatory Visit (INDEPENDENT_AMBULATORY_CARE_PROVIDER_SITE_OTHER): Payer: PPO

## 2022-03-09 DIAGNOSIS — I10 Essential (primary) hypertension: Secondary | ICD-10-CM | POA: Diagnosis not present

## 2022-03-09 DIAGNOSIS — I82412 Acute embolism and thrombosis of left femoral vein: Secondary | ICD-10-CM | POA: Diagnosis not present

## 2022-03-11 ENCOUNTER — Telehealth: Payer: Self-pay | Admitting: Cardiology

## 2022-03-11 NOTE — Telephone Encounter (Signed)
Patient informed of results.  

## 2022-03-11 NOTE — Telephone Encounter (Signed)
?  Pt is returning call to get result ?

## 2022-04-20 DIAGNOSIS — E7800 Pure hypercholesterolemia, unspecified: Secondary | ICD-10-CM | POA: Insufficient documentation

## 2022-04-21 ENCOUNTER — Ambulatory Visit (INDEPENDENT_AMBULATORY_CARE_PROVIDER_SITE_OTHER): Payer: PPO | Admitting: Cardiology

## 2022-04-21 ENCOUNTER — Encounter: Payer: Self-pay | Admitting: Cardiology

## 2022-04-21 VITALS — BP 100/70 | HR 106 | Ht 68.0 in | Wt 174.0 lb

## 2022-04-21 DIAGNOSIS — I10 Essential (primary) hypertension: Secondary | ICD-10-CM | POA: Diagnosis not present

## 2022-04-21 MED ORDER — AMLODIPINE BESYLATE 5 MG PO TABS: 5.0000 mg | ORAL_TABLET | Freq: Every day | ORAL | 3 refills | Status: AC

## 2022-04-21 NOTE — Patient Instructions (Addendum)
Medication Instructions:  Your physician has recommended you make the following change in your medication:   Amlodipine 5 mg daily (if blood pressure consistently less than 001 systolic decrease to 5 mg every other day)   *If you need a refill on your cardiac medications before your next appointment, please call your pharmacy*   Lab Work: None If you have labs (blood work) drawn today and your tests are completely normal, you will receive your results only by: [place] (if you have MyChart) OR A paper copy in the mail If you have any lab test that is abnormal or we need to change your treatment, we will call you to review the results.   Testing/Procedures: None   Follow-Up: At [facility], you and your health needs are our priority.  As part of our continuing mission to provide you with exceptional heart care, we have created designated Provider Care Teams.  These Care Teams include your primary Cardiologist (physician) and Advanced Practice Providers (APPs -  Physician Assistants and Nurse Practitioners) who all work together to provide you with the care you need, when you need it.  We recommend signing up for the patient portal called "MyChart".  Sign up information is provided on this After Visit Summary.  MyChart is used to connect with patients for Virtual Visits (Telemedicine).  Patients are able to view lab/test results, encounter notes, upcoming appointments, etc.  Non-urgent messages can be sent to your provider as well.   To learn more about what you can do with MyChart, go to [email or web address].    Your next appointment:   Follow up as needed  The format for your next appointment:   In Person  Provider:   Shirlee More, MD    Other Instructions None  Important Information About Sugar         [facility]  Tips to measure your blood pressure correctly  To determine whether you have hypertension, a medical professional will take a blood pressure  reading. How you prepare for the test, the position of your arm, and other factors can change a blood pressure reading by 10% or more. That could be enough to hide high blood pressure, start you on a drug you don't really need, or lead your doctor to incorrectly adjust your medications. National and international guidelines offer specific instructions for measuring blood pressure. If a doctor, nurse, or medical assistant isn't doing it right, don't hesitate to ask him or her to get with the guidelines. Here's what you can do to ensure a correct reading:  Don't drink a caffeinated beverage or smoke during the 30 minutes before the test.  Sit quietly for five minutes before the test begins.  During the measurement, sit in a chair with your feet on the floor and your arm supported so your elbow is at about heart level.  The inflatable part of the cuff should completely cover at least 80% of your upper arm, and the cuff should be placed on bare skin, not over a shirt.  Don't talk during the measurement.  Have your blood pressure measured twice, with a brief break in between. If the readings are different by 5 points or more, have it done a third time. There are times to break these rules. If you sometimes feel lightheaded when getting out of bed in the morning or when you stand after sitting, you should have your blood pressure checked while seated and then while standing to see if it falls from one position  to the next. Because blood pressure varies throughout the day, your doctor will rarely diagnose hypertension on the basis of a single reading. Instead, he or she will want to confirm the measurements on at least two occasions, usually within a few weeks of one another. The exception to this rule is if you have a blood pressure reading of 180/110 mm Hg or higher. A result this high usually calls for prompt treatment. It's also a good idea to have your blood pressure measured in both arms at least once,  since the reading in one arm (usually the right) may be higher than that in the left. A 2014 study in The American Journal of Medicine of nearly 3,400 people found average arm- to-arm differences in systolic blood pressure of about 5 points. The higher number should be used to make treatment decisions. In 2017, new guidelines from the [place], the [employer] of Cardiology, and nine other health organizations lowered the diagnosis of high blood pressure to 130/80 mm Hg or higher for all adults. The guidelines also redefined the various blood pressure categories to now include normal, elevated, Stage 1 hypertension, Stage 2 hypertension, and hypertensive crisis (see "Blood pressure categories"). Blood pressure categories  Blood pressure category SYSTOLIC (upper number)  DIASTOLIC (lower number)  Normal Less than 120 mm Hg and Less than 80 mm Hg  Elevated 120-129 mm Hg and Less than 80 mm Hg  High blood pressure: Stage 1 hypertension 130-139 mm Hg or 80-89 mm Hg  High blood pressure: Stage 2 hypertension 140 mm Hg or higher or 90 mm Hg or higher  Hypertensive crisis (consult your doctor immediately) Higher than 180 mm Hg and/or Higher than 120 mm Hg  Source: American Heart Association and American Stroke Association. For more on getting your blood pressure under control, buy Controlling Your Blood Pressure, a Special Health Report from [facility]

## 2022-04-21 NOTE — Addendum Note (Signed)
Addended by: Edwyna Shell I on: 04/21/2022 03:39 PM   Modules accepted: Orders

## 2022-04-21 NOTE — Progress Notes (Signed)
Cardiology Office Note:    Date:  04/21/2022   ID:  Connie Duncan, DOB 09-28-1956, MRN [id number]  PCP:  Myrlene Broker, MD  Cardiologist:  Shirlee More, MD    Referring MD: Myrlene Broker, MD    ASSESSMENT:    1. Essential hypertension    PLAN:    In order of problems listed above:  She has much improved blood pressure is at target switching to a higher potency ARB continue current treatment with her loop diuretic calcium channel blocker ARB.  Follow-up with her PCP if she needs to see me she will contact me and come back to the office Stable hyperlipidemia continue statin  Next appointment: As needed   Medication Adjustments/Labs and Tests Ordered: Current medicines are reviewed at length with the patient today.  Concerns regarding medicines are outlined above.  No orders of the defined types were placed in this encounter.  No orders of the defined types were placed in this encounter.   Chief Complaint  Patient presents with   Hypertension    History of Present Illness:    Connie Duncan is a 66 y.o. female with a hx of hypertension hyperlipidemia venous thromboembolism with previous DVT and pulmonary embolism last seen 02/26/2022 for hypertension.. Compliance with diet, lifestyle and medications: Yes  She feels better no further headaches Blood pressures are very well controlled in fact at times less than 546 systolic Blood pressure today 100/70.  If she consistently finds her top number less than 110 she will reduce her calcium channel blocker to every other day Fortunately her renal artery duplex shows mild nonflow limiting stenosis  An echocardiogram at [facility] 05/19/2021 left ventricle normal in size wall thickness EF 55 to 60% right ventricle normal size and function both atria were normal in size there is trace mitral and aortic regurgitation seen.   She had an EKG at [facility] 01/10/2022 independently reviewed showing sinus rhythm  normal EKG she is seen in the emergency room same date for elevated blood pressure and headache.  Blood pressure recorded 166/95 CBC showed mild anemia hemoglobin 11.9 platelets 01/15/1999 creatinine 0.7 potassium 3.5 she was taking lisinopril and was started on metoprolol.   She has been to [facility] in July 2022 with DVT and pulmonary embolism with a description of left lower extremity showing thrombus in the left popliteal and femoral veins shortness of breath underwent CTA showing bilateral pulmonary emboli with borderline right heart strain.  She was treated with Xarelto.  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 Past Medical History:  Diagnosis Date   Acute saddle pulmonary embolism without acute cor pulmonale ([facility]) 05/26/2021   Allergic rhinitis 12/25/2015   Asthma, mild intermittent 12/25/2015   Chronic bilateral low back pain 09/17/2013   Chronic fatigue 12/25/2015   Chronic superficial gastritis without bleeding 03/17/2018   DDD (degenerative disc disease), lumbar 12/25/2015   Depression, major, in remission ([place]) 12/25/2015   DVT (deep venous thrombosis) ([place])    Essential hypertension 12/25/2015   Extremity cyanosis 08/17/2021   GAD (generalized anxiety disorder) 12/25/2015   GERD (gastroesophageal reflux disease) 12/25/2015   Heart palpitations 01/19/2021   High cholesterol    Hypercalcemia 02/22/2017   Hypertension    IBS (irritable bowel syndrome) 12/25/2015   Left femoral vein DVT ([place]) 09/06/2021   Lumbosacral spondylosis  without myelopathy 09/17/2013   Migraine syndrome 07/12/2017   Mixed dyslipidemia 05/29/2020   Obesity (BMI 30-39.9) 12/25/2015   OSA (obstructive sleep apnea) 12/25/2015   Pulmonary emboli ([facility])    Swelling of left lower extremity 08/17/2021   Vitamin D deficiency  02/22/2017   Voiding dysfunction 12/25/2015    Past Surgical History:  Procedure Laterality Date   APPENDECTOMY     BACK SURGERY     CHOLECYSTECTOMY     ESOPHAGOGASTRODUODENOSCOPY     Dr. Melina Copa   TOTAL KNEE ARTHROPLASTY Left    TUBAL LIGATION      Current Medications: Current Meds  Medication Sig   amLODipine (NORVASC) 5 MG tablet Take 1 tablet by mouth daily.   Cholecalciferol 250 MCG (10000 UT) CAPS Take 1 capsule by mouth daily.   eletriptan (RELPAX) 40 MG tablet Take 40 mg by mouth daily as needed for migraine.   famotidine (PEPCID) 40 MG tablet Take 40 mg by mouth daily.   fluticasone (FLONASE) 50 MCG/ACT nasal spray Place 1 spray into both nostrils daily.   furosemide (LASIX) 20 MG tablet TAKE ONE TABLET DAILY IN THE MORNING FORFLUID   LOVENOX 80 MG/0.8ML injection 80 mg SQ BID Dispense 60 prefilled syringes for 1 month supply   omeprazole (PRILOSEC) 20 MG capsule Take 20 mg by mouth daily.   rosuvastatin (CRESTOR) 20 MG tablet Take 20 mg by mouth at bedtime.   telmisartan (MICARDIS) 40 MG tablet Take 1 tablet (40 mg total) by mouth daily.   venlafaxine XR (EFFEXOR-XR) 150 MG 24 hr capsule 150 mg daily with breakfast.     Allergies:   Patient has no known allergies.   Social History   Socioeconomic History   Marital status: Married    Spouse name: Not on file   Number of children: Not on file   Years of education: Not on file   Highest education level: Not on file  Occupational History   Not on file  Tobacco Use   Smoking status: Never   Smokeless tobacco: Never  Vaping Use   Vaping Use: Never used  Substance and Sexual Activity   Alcohol use: Never   Drug use: Never   Sexual activity: Not on file  Other Topics Concern   Not on file  Social History Narrative   Not on file   Social Determinants of Health   Financial Resource Strain: Not on file  Food Insecurity: Not on file  Transportation Needs: Not on file  Physical Activity: Not on file  Stress:  Not on file  Social Connections: Not on file     Family History: The patient's family history includes COPD in her brother; Colon cancer in her maternal aunt; Diabetes in her mother; Heart attack in her mother; Lung cancer in her father; Stroke in her mother. ROS:   Please see the history of present illness.    All other systems reviewed and are negative.  EKGs/Labs/Other Studies Reviewed:    The following studies were reviewed today:   Recent Labs: 02/22/2022 potassium 4.3 calcium 10.8 creatinine 0.83 08/17/2021: ALT 9 08/18/2021: BUN 16; Creatinine, Ser 0.85; Hemoglobin 12.6; Platelets 285; Potassium 4.3; Sodium 139  Recent Lipid Panel No results found for: "CHOL", "TRIG", "HDL", "CHOLHDL", "VLDL", "LDLCALC", "LDLDIRECT"  Physical Exam:    VS:  BP 100/70 (BP Location: Left Arm, Patient Position: Sitting)   Pulse (!) 106   Ht '5\' 8"'$  (1.727 m)   Wt 174 lb (78.9 kg)   SpO2 98%  BMI 26.46 kg/m     Wt Readings from Last 3 Encounters:  04/21/22 174 lb (78.9 kg)  02/26/22 172 lb 12.8 oz (78.4 kg)  09/25/21 180 lb 6.4 oz (81.8 kg)     GEN:  Well nourished, well developed in no acute distress HEENT: Normal NECK: No JVD; No carotid bruits LYMPHATICS: No lymphadenopathy CARDIAC: RRR, no murmurs, rubs, gallops RESPIRATORY:  Clear to auscultation without rales, wheezing or rhonchi  ABDOMEN: Soft, non-tender, non-distended MUSCULOSKELETAL:  No edema; No deformity  SKIN: Warm and dry NEUROLOGIC:  Alert and oriented x 3 PSYCHIATRIC:  Normal affect    Signed, Shirlee More, MD  04/21/2022 3:24 PM    [facility]

## 2023-03-02 ENCOUNTER — Other Ambulatory Visit: Payer: Self-pay | Admitting: Cardiology

## 2023-03-02 NOTE — Telephone Encounter (Signed)
Refill # [id number] only with message needs appointment or to contact primary care for future refills

## 2023-05-30 ENCOUNTER — Other Ambulatory Visit: Payer: Self-pay | Admitting: Cardiology

## 2023-08-22 ENCOUNTER — Other Ambulatory Visit: Payer: Self-pay | Admitting: Cardiology

## 2023-10-19 ENCOUNTER — Other Ambulatory Visit: Payer: Self-pay | Admitting: Cardiology

## 2023-11-16 ENCOUNTER — Other Ambulatory Visit: Payer: Self-pay | Admitting: Cardiology

## 2023-12-05 ENCOUNTER — Other Ambulatory Visit: Payer: Self-pay | Admitting: Cardiology
